# Patient Record
Sex: Male | Born: 1990 | Hispanic: No | Marital: Single | Smoking: Former smoker
Health system: Southern US, Community
[De-identification: ages and names within clinical notes are randomized; demographics above are authoritative.]

## PROBLEM LIST (undated history)

## (undated) DIAGNOSIS — F909 Attention-deficit hyperactivity disorder, unspecified type: Secondary | ICD-10-CM

## (undated) DIAGNOSIS — F419 Anxiety disorder, unspecified: Secondary | ICD-10-CM

## (undated) HISTORY — DX: Attention-deficit hyperactivity disorder, unspecified type: F90.9

## (undated) HISTORY — DX: Anxiety disorder, unspecified: F41.9

## (undated) HISTORY — PX: TONSILLECTOMY: SUR1361

---
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
## 2012-06-18 ENCOUNTER — Emergency Department: Payer: Self-pay | Admitting: Emergency Medicine

## 2013-05-16 ENCOUNTER — Emergency Department: Payer: Self-pay | Admitting: Emergency Medicine

## 2014-08-28 ENCOUNTER — Emergency Department: Payer: Self-pay | Admitting: Emergency Medicine

## 2014-09-13 ENCOUNTER — Emergency Department: Payer: Self-pay | Admitting: Emergency Medicine

## 2014-11-09 IMAGING — CR DG CHEST 2V
1 series · 2 of 2 positions shown · non-contrast
Comparison: None.

CLINICAL DATA: Initial encounter for 2 day history of cough and
fever

EXAM:
CHEST  2 VIEW

[Series 1: dxr chest pa (or ap) and lateral · 0.14mm/px · 2 of 2 slices shown]
[im 1/2]
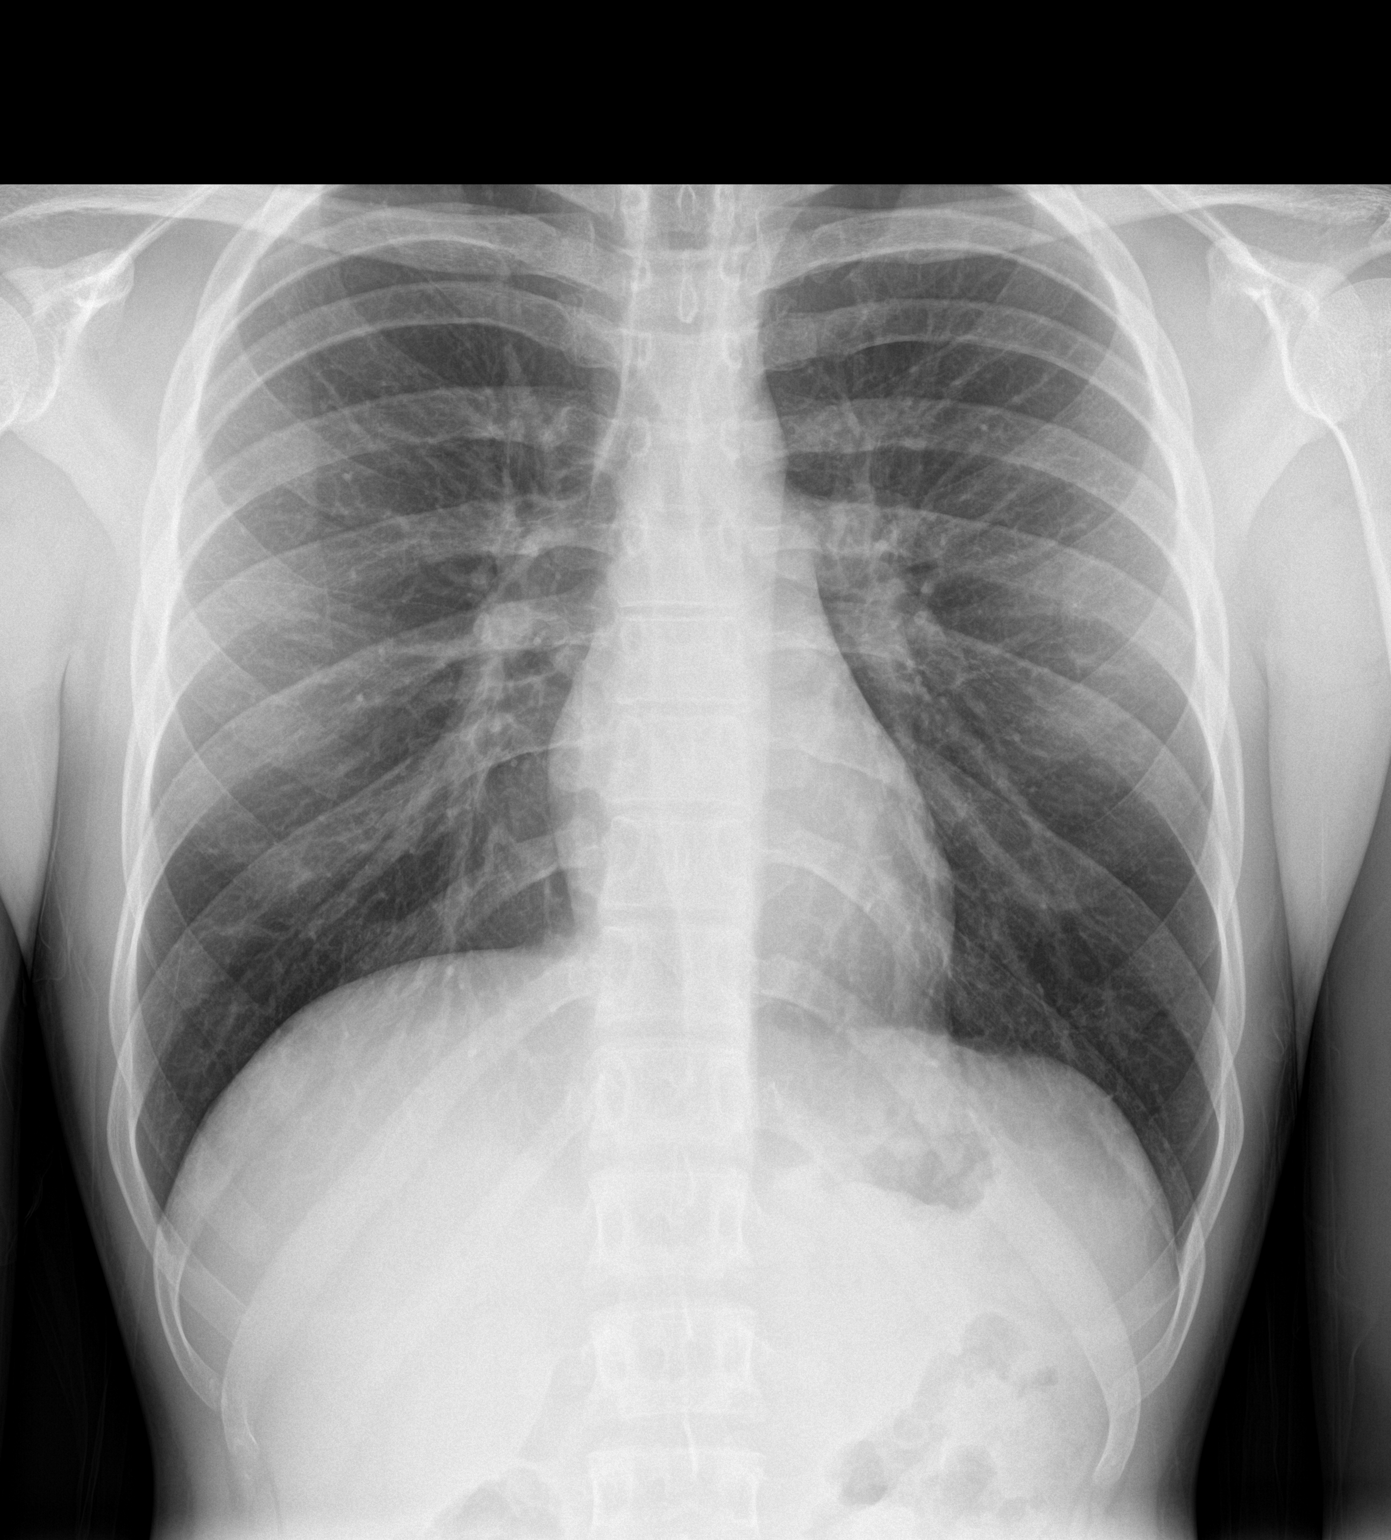
[im 2/2]
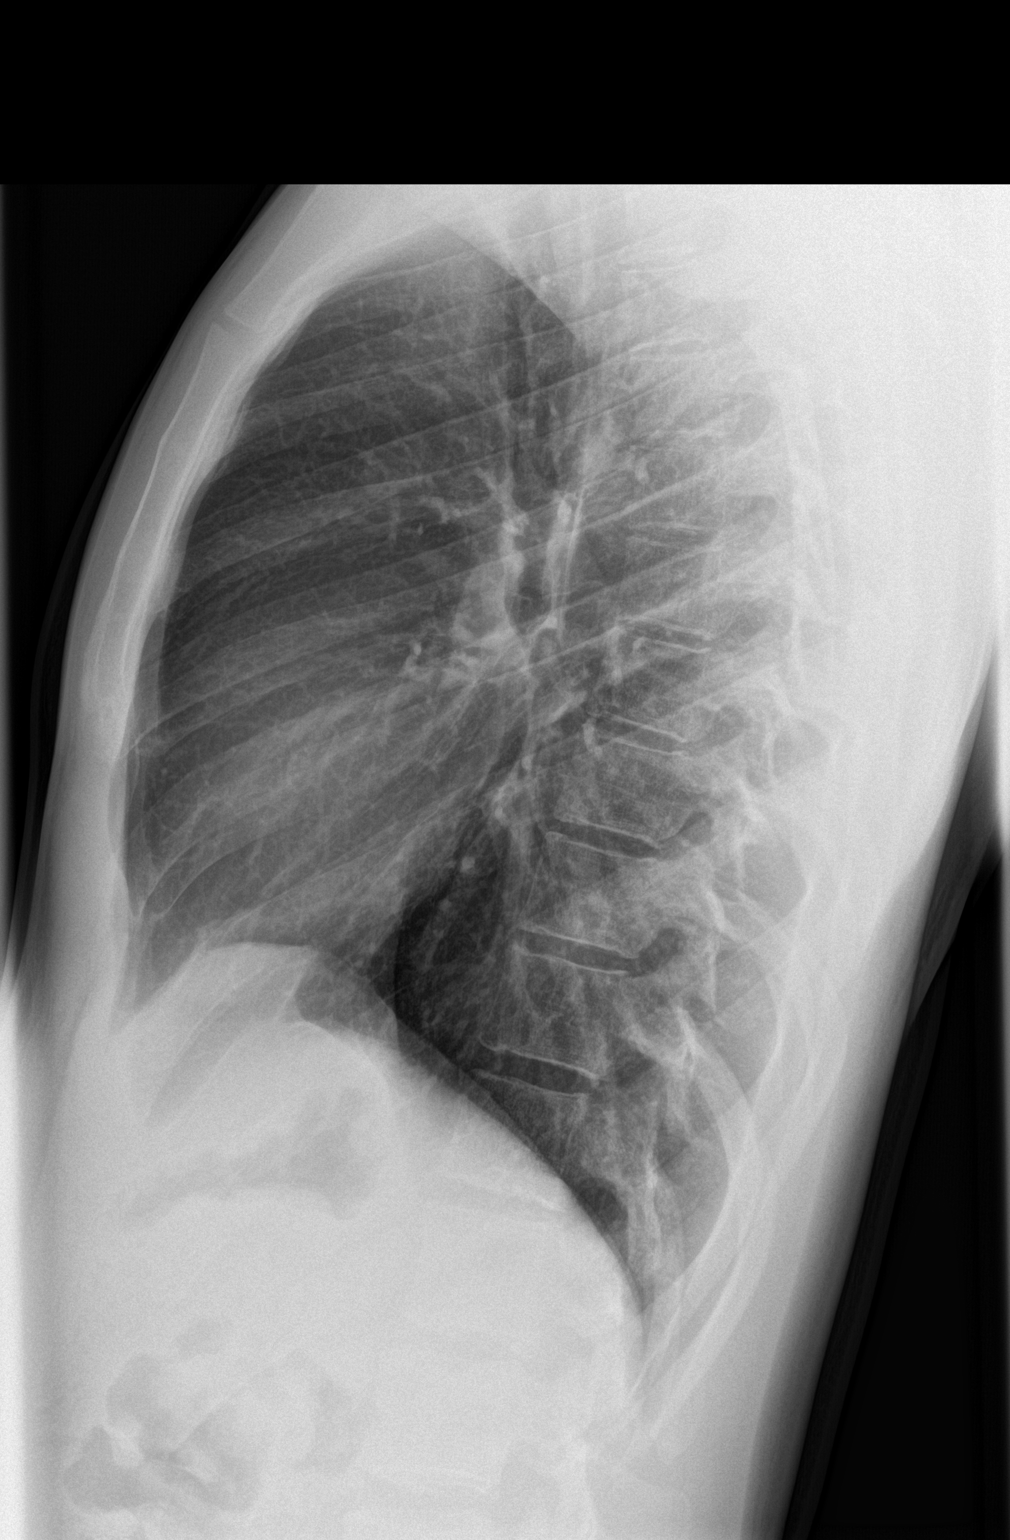

[2 of 2 positions shown; findings below may reference images not displayed]

FINDINGS: The heart size and mediastinal contours are within normal limits.
Both lungs are clear. The visualized skeletal structures are
unremarkable.
IMPRESSION: No active cardiopulmonary disease.

## 2016-03-28 ENCOUNTER — Encounter: Payer: Self-pay | Admitting: Emergency Medicine

## 2016-03-28 ENCOUNTER — Emergency Department
Admission: EM | Admit: 2016-03-28 | Discharge: 2016-03-29 | Disposition: A | Discharge status: Home / Self Care | Payer: 59 | Attending: Emergency Medicine | Admitting: Emergency Medicine

## 2016-03-28 DIAGNOSIS — F329 Major depressive disorder, single episode, unspecified: Secondary | ICD-10-CM | POA: Insufficient documentation

## 2016-03-28 DIAGNOSIS — S59911A Unspecified injury of right forearm, initial encounter: Secondary | ICD-10-CM | POA: Diagnosis present

## 2016-03-28 DIAGNOSIS — Y999 Unspecified external cause status: Secondary | ICD-10-CM | POA: Insufficient documentation

## 2016-03-28 DIAGNOSIS — Y929 Unspecified place or not applicable: Secondary | ICD-10-CM | POA: Insufficient documentation

## 2016-03-28 DIAGNOSIS — Y939 Activity, unspecified: Secondary | ICD-10-CM | POA: Diagnosis not present

## 2016-03-28 DIAGNOSIS — X789XXA Intentional self-harm by unspecified sharp object, initial encounter: Secondary | ICD-10-CM | POA: Diagnosis not present

## 2016-03-28 DIAGNOSIS — Z7289 Other problems related to lifestyle: Secondary | ICD-10-CM

## 2016-03-28 DIAGNOSIS — S51811A Laceration without foreign body of right forearm, initial encounter: Secondary | ICD-10-CM | POA: Insufficient documentation

## 2016-03-28 DIAGNOSIS — IMO0002 Reserved for concepts with insufficient information to code with codable children: Secondary | ICD-10-CM

## 2016-03-28 DIAGNOSIS — F17210 Nicotine dependence, cigarettes, uncomplicated: Secondary | ICD-10-CM | POA: Insufficient documentation

## 2016-03-28 NOTE — ED Notes (Signed)
Pt. Has a self-inflicted laceration to rt. Forearm.  Pt. States "I am a cutter, but I did not mean to cut this deep".

## 2016-03-29 LAB — COMPREHENSIVE METABOLIC PANEL
ALBUMIN: 4.4 g/dL (ref 3.5–5.0)
ALK PHOS: 55 U/L (ref 38–126)
ALT: 16 U/L — AB (ref 17–63)
AST: 17 U/L (ref 15–41)
Anion gap: 9 (ref 5–15)
BUN: 8 mg/dL (ref 6–20)
CHLORIDE: 107 mmol/L (ref 101–111)
CO2: 22 mmol/L (ref 22–32)
CREATININE: 0.77 mg/dL (ref 0.61–1.24)
Calcium: 9 mg/dL (ref 8.9–10.3)
GFR calc non Af Amer: >60 mL/min (ref >60)
GLUCOSE: 163 mg/dL — AB (ref 65–99)
Potassium: 3.6 mmol/L (ref 3.5–5.1)
SODIUM: 138 mmol/L (ref 135–145)
Total Bilirubin: 0.6 mg/dL (ref 0.3–1.2)
Total Protein: 6.8 g/dL (ref 6.5–8.1)

## 2016-03-29 LAB — URINE DRUG SCREEN, QUALITATIVE (ARMC ONLY)
Amphetamines, Ur Screen: NOT DETECTED
Barbiturates, Ur Screen: NOT DETECTED
Benzodiazepine, Ur Scrn: NOT DETECTED
CANNABINOID 50 NG, UR ~~LOC~~: NOT DETECTED
COCAINE METABOLITE, UR ~~LOC~~: NOT DETECTED
MDMA (ECSTASY) UR SCREEN: NOT DETECTED
Methadone Scn, Ur: NOT DETECTED
OPIATE, UR SCREEN: NOT DETECTED
Phencyclidine (PCP) Ur S: NOT DETECTED
Tricyclic, Ur Screen: NOT DETECTED

## 2016-03-29 LAB — CBC
HCT: 46.9 % (ref 40.0–52.0)
HEMOGLOBIN: 15.4 g/dL (ref 13.0–18.0)
MCH: 29.5 pg (ref 26.0–34.0)
MCHC: 32.9 g/dL (ref 32.0–36.0)
MCV: 89.7 fL (ref 80.0–100.0)
PLATELETS: 174 10*3/uL (ref 150–440)
RBC: 5.23 MIL/uL (ref 4.40–5.90)
RDW: 13.5 % (ref 11.5–14.5)
WBC: 6.6 10*3/uL (ref 3.8–10.6)

## 2016-03-29 LAB — SALICYLATE LEVEL: Salicylate Lvl: <4 mg/dL (ref 2.8–30.0)

## 2016-03-29 LAB — ACETAMINOPHEN LEVEL

## 2016-03-29 LAB — ETHANOL: Alcohol, Ethyl (B): 169 mg/dL — ABNORMAL HIGH (ref <5)

## 2016-03-29 MED ORDER — BACITRACIN ZINC 500 UNIT/GM EX OINT
TOPICAL_OINTMENT | CUTANEOUS | Status: AC
  Administered 2016-03-29: 1 via TOPICAL
  Filled 2016-03-29: qty 0.9

## 2016-03-29 MED ORDER — BACITRACIN ZINC 500 UNIT/GM EX OINT
TOPICAL_OINTMENT | Freq: Every day | CUTANEOUS | Status: AC
  Administered 2016-03-29: 1 via TOPICAL

## 2016-03-29 MED ORDER — IBUPROFEN 600 MG PO TABS
ORAL_TABLET | ORAL | Status: AC
  Administered 2016-03-29: 600 mg via ORAL
  Filled 2016-03-29: qty 1

## 2016-03-29 MED ORDER — LIDOCAINE-EPINEPHRINE (PF) 1 %-1:200000 IJ SOLN
INTRAMUSCULAR | Status: AC
  Administered 2016-03-29: 04:00:00 via INTRADERMAL
  Filled 2016-03-29: qty 30

## 2016-03-29 MED ORDER — IBUPROFEN 600 MG PO TABS
600.0000 mg | ORAL_TABLET | Freq: Once | ORAL | Status: AC
  Administered 2016-03-29: 600 mg via ORAL

## 2016-03-29 MED ORDER — LIDOCAINE-EPINEPHRINE (PF) 2 %-1:200000 IJ SOLN: 10.0000 mL | Freq: Once | INTRAMUSCULAR | Status: DC

## 2016-03-29 NOTE — ED Notes (Signed)
Patient stable and ambulatory.  Verbalized understanding of discharge instructions.   

## 2016-03-29 NOTE — ED Provider Notes (Signed)
[facility] Emergency Department Provider Note  ____________________________________________  Time seen: Approximately 12:03 AM  I have reviewed the triage vital signs and the nursing notes.   HISTORY  Chief Complaint Laceration    HPI Lance Fox is a 25 y.o. male who comes into the hospital today with a self-inflicted cut to his right forearm. The patient reports that he was cutting but did not mean to cut some deeply. When asked him if he was trying to kill himself he said yes and no. He reports that when he noticed how deep it was he decided to call the ambulance. When asked why and what made him cut he reports that he is unable to answer. The patient reports that he has been feeling depressed but does not take any medications for it. He reports that he does cut but is sporadically. He was drinking tonight and reports he had a 40 ounce beer at about 10:15 and 30 minutes later had another 25 ounces. He also said tonight that he took a migraine pill. The patient is very evasive and unable to answer questions but he does make good eye contact. He's had no hallucinations and no other complaints at this time.   History reviewed. No pertinent past medical history.  There are no active problems to display for this patient.   Past Surgical History  Procedure Laterality Date  . Tonsillectomy      No current outpatient prescriptions on file.  Allergies Review of patient's allergies indicates no known allergies.  Family History  Problem Relation Age of Onset  . Heart failure Other     Social History Social History  Substance Use Topics  . Smoking status: Current Every Day Smoker -- 1.00 packs/day    Types: Cigarettes  . Smokeless tobacco: None  . Alcohol Use: 0.6 oz/week    1 Cans of beer per week    Review of Systems Constitutional: No fever/chills Eyes: No visual changes. ENT: No sore throat. Cardiovascular: Denies chest  pain. Respiratory: Denies shortness of breath. Gastrointestinal: No abdominal pain.  No nausea, no vomiting.  No diarrhea.  No constipation. Genitourinary: Negative for dysuria. Musculoskeletal: Negative for back pain. Skin: Laceration to right forearm Neurological: Negative for headaches, focal weakness or numbness.  10-point ROS otherwise negative.  ____________________________________________   PHYSICAL EXAM:  VITAL SIGNS: ED Triage Vitals  Enc Vitals Group     BP 03/28/16 2355 131/72 mmHg     Pulse Rate 03/28/16 2355 96     Resp 03/28/16 2355 18     Temp 03/28/16 2355 97.8 F (36.6 C)     Temp src --      SpO2 03/28/16 2350 98 %     Weight 03/28/16 2355 155 lb (70.308 kg)     Height 03/28/16 2355 6\' 2"  (1.88 m)     Head Cir --      Peak Flow --      Pain Score 03/28/16 2356 4     Pain Loc --      Pain Edu? --      Excl. in GC? --     Constitutional: Alert and oriented. Well appearing and in Moderate distress. Eyes: Conjunctivae are normal. PERRL. EOMI. Head: Atraumatic. Nose: No congestion/rhinnorhea. Mouth/Throat: Mucous membranes are moist.  Oropharynx non-erythematous. Cardiovascular: Normal rate, regular rhythm. Grossly normal heart sounds.  Good peripheral circulation. Respiratory: Normal respiratory effort.  No retractions. Lungs CTAB. Gastrointestinal: Soft and nontender. No distention. Positive bowel sounds Musculoskeletal:  No lower extremity tenderness nor edema. The patient is able to move all the fingers on his right forearm where the laceration is located. Color motion and sensation is intact. Neurologic:  Normal speech and language. Skin:  Skin is warm, dry and intact. 3-1/2-4 inch laceration to the patient's right forearm horizontally. Psychiatric: Mood and affect are normal.   ____________________________________________   LABS (all labs ordered are listed, but only abnormal results are displayed)  Labs Reviewed  COMPREHENSIVE METABOLIC PANEL -  Abnormal; Notable for the following:    Glucose, Bld 163 (*)    ALT 16 (*)    All other components within normal limits  ETHANOL - Abnormal; Notable for the following:    Alcohol, Ethyl (B) 169 (*)    All other components within normal limits  ACETAMINOPHEN LEVEL - Abnormal; Notable for the following:    Acetaminophen (Tylenol), Serum <10 (*)    All other components within normal limits  CBC  URINE DRUG SCREEN, QUALITATIVE ([facility] ONLY)  SALICYLATE LEVEL   ____________________________________________  EKG  None ____________________________________________  RADIOLOGY  None ____________________________________________   PROCEDURES  Procedure(s) performed: please, see procedure note(s).   LACERATION REPAIR Performed by: Lucrezia Europe P Authorized by: Lucrezia Europe P Consent: Verbal consent obtained. Risks and benefits: risks, benefits and alternatives were discussed Consent given by: patient Patient identity confirmed: provided demographic data Prepped and Draped in normal sterile fashion Wound explored  Laceration Location: right forearm  Laceration Length: 12 cm  No Foreign Bodies seen or palpated  Anesthesia: local infiltration  Local anesthetic: lidocaine 1% with epinephrine  Anesthetic total: 4 ml  Irrigation method: syringe Amount of cleaning: standard  Skin closure: 5.0 vicryl, 4.0 eithilon  Number of sutures: 12 superficial, 4 deep  Technique: locked continuous and simple interrupted  Patient tolerance: Patient tolerated the procedure well with no immediate complications.   Critical Care performed: No  ____________________________________________   INITIAL IMPRESSION / ASSESSMENT AND PLAN / ED COURSE  Pertinent labs & imaging results that were available during my care of the patient were reviewed by me and considered in my medical decision making (see chart for details).  This is a 25 year old male who comes into the hospital today  with a self-inflicted laceration to his right forearm. The patient does have some significant bleeding but otherwise has no other injury or concern. I will have TTS evaluate the patient as he is very evasive about his intention with cutting his arm. I will also repair the patient's laceration. I will check some blood work on the patient's well.  The patient was evaluated by TTS. The patient informed them that he was not suicidal. I did go back into talk to the patient and he reports that he is not suicidal. While he is depressed he does not feel that he needs to stay in the hospital and he rather follow up with someone as an outpatient. I confirmed this with the patient's mother and while she was skeptical as to the patient's statements she felt that he should see a therapist and was not concerned about him staying to see a psychiatrist. The patient did receive some sutures and the bleeding of his wound was controlled. The patient will be discharged to follow-up with [facility] for further evaluation and treatment of his depression. The patient contracted for safety with TTS prior to his discharge.  ____________________________________________   FINAL CLINICAL IMPRESSION(S) / ED DIAGNOSES  Final diagnoses:  Laceration  Self-inflicted injury  Rebecka Apley, MD 03/29/16 [phone]

## 2016-03-29 NOTE — BH Assessment (Signed)
Assessment Note  Lance Fox is an 25 y.o. male presenting to the ED with a self-inflicted cuts to his right forearm. Patient reports that he was cutting but did not mean to cut so deeply. When asked him if he was trying to kill himself he said yes and no. He reports that he decided to call EMS after he realized how deep the cuts were. When asked why and what made him cut he reports that he is unable to answer. The patient reports that he has been feeling depressed but does not take any medications for it. He reports that he does cut but is sporadically. He did eventually state that he did not want to kill himself.  Pt agreed to contact for safety.  Pt did admit that he had been drinking tonight.  Pt denies any HI and any auditory/visual hallucinations.  Diagnosis: Depression  Past Medical History: History reviewed. No pertinent past medical history.  Past Surgical History  Procedure Laterality Date  . Tonsillectomy      Family History:  Family History  Problem Relation Age of Onset  . Heart failure Other     Social History:  reports that he has been smoking Cigarettes.  He has been smoking about 1.00 pack per day. He does not have any smokeless tobacco history on file. He reports that he drinks about 0.6 oz of alcohol per week. He reports that he does not use illicit drugs.  Additional Social History:  Alcohol / Drug Use History of alcohol / drug use?: No history of alcohol / drug abuse (Pt denies)  CIWA: CIWA-Ar BP: 132/85 mmHg Pulse Rate: 95 COWS:    Allergies: No Known Allergies  Home Medications:  (Not in a hospital admission)  OB/GYN Status:  No LMP for male patient.  General Assessment Data Location of Assessment: [facility] ED TTS Assessment: In system Is this a Tele or Face-to-Face Assessment?: Face-to-Face Is this an Initial Assessment or a Re-assessment for this encounter?: Initial Assessment Marital status: Single Maiden name: N/A Is patient pregnant?:  No Pregnancy Status: No Living Arrangements: Alone Can pt return to current living arrangement?: Yes Admission Status: Voluntary Is patient capable of signing voluntary admission?: Yes Referral Source: Self/Family/Friend Insurance type: [employer]     Crisis Care Plan Living Arrangements: Alone Legal Guardian: Other: (self) Name of Psychiatrist: None reported Name of Therapist: None reported  Education Status Is patient currently in school?: No Current Grade: N/A Highest grade of school patient has completed: 12th Name of school: [employer] person: N/A  Risk to self with the past 6 months Suicidal Ideation: No Has patient been a risk to self within the past 6 months prior to admission? : No Suicidal Intent: No Has patient had any suicidal intent within the past 6 months prior to admission? : No Is patient at risk for suicide?: No Suicidal Plan?: No Has patient had any suicidal plan within the past 6 months prior to admission? : No Access to Means: Yes Specify Access to Suicidal Means: Pt has access to razors What has been your use of drugs/alcohol within the last 12 months?: None reported Previous Attempts/Gestures: No How many times?: 0 Other Self Harm Risks: Pt reports a history of cutting Triggers for Past Attempts: None known Intentional Self Injurious Behavior: Cutting Comment - Self Injurious Behavior: Pt reports superficial cuts on his thighs and forearm Family Suicide History: No Recent stressful life event(s): Other (Comment) (Pt refuses to answer) Persecutory voices/beliefs?: No Depression:  Yes Depression Symptoms: Loss of interest in usual pleasures Substance abuse history and/or treatment for substance abuse?: Yes Suicide prevention information given to non-admitted patients: Not applicable  Risk to Others within the past 6 months Homicidal Ideation: No Does patient have any lifetime risk of violence toward others beyond the six  months prior to admission? : No Thoughts of Harm to Others: No Current Homicidal Intent: No Current Homicidal Plan: No Access to Homicidal Means: No Identified Victim: None identified History of harm to others?: No Assessment of Violence: None Noted Violent Behavior Description: None identified Does patient have access to weapons?: No Criminal Charges Pending?: No Does patient have a court date: No Is patient on probation?: No  Psychosis Hallucinations: None noted Delusions: None noted  Mental Status Report Appearance/Hygiene: In hospital gown Eye Contact: Good Motor Activity: Unable to assess, Freedom of movement Speech: Logical/coherent Level of Consciousness: Alert Mood: Pleasant Affect: Appropriate to circumstance Anxiety Level: None Thought Processes: Coherent, Relevant Judgement: Partial Orientation: Person, Place, Time, Situation Obsessive Compulsive Thoughts/Behaviors: None  Cognitive Functioning Concentration: Normal Memory: Recent Intact, Remote Intact IQ: Average Insight: Fair Impulse Control: Fair Appetite: Good Weight Loss: 0 Weight Gain: 0 Sleep: No Change Total Hours of Sleep: 6 Vegetative Symptoms: None  ADLScreening [facility] Assessment [facility]) Patient's cognitive ability adequate to safely complete daily activities?: Yes Patient able to express need for assistance with ADLs?: Yes Independently performs ADLs?: Yes (appropriate for developmental age)  Prior Inpatient Therapy Prior Inpatient Therapy: No Prior Therapy Dates: N/A Prior Therapy Facilty/Provider(s): N/A Reason for Treatment: N/A  Prior Outpatient Therapy Prior Outpatient Therapy: No Prior Therapy Dates: N/A Prior Therapy Facilty/Provider(s): N/A Reason for Treatment: N/A Does patient have an ACCT team?: No Does patient have Intensive In-House Services?  : No Does patient have [facility] services? : No Does patient have P4CC services?: No  ADL Screening (condition at time of  admission) Patient's cognitive ability adequate to safely complete daily activities?: Yes Patient able to express need for assistance with ADLs?: Yes Independently performs ADLs?: Yes (appropriate for developmental age)       Abuse/Neglect Assessment (Assessment to be complete while patient is alone) Physical Abuse: Denies Verbal Abuse: Denies Sexual Abuse: Denies Exploitation of patient/patient's resources: Denies Self-Neglect: Denies Values / Beliefs Cultural Requests During Hospitalization: None Spiritual Requests During Hospitalization: None Consults Spiritual Care Consult Needed: No Social Work Consult Needed: No Merchant navy officerAdvance Directives (For Healthcare) Does patient have an advance directive?: No    Additional Information 1:1 In Past 12 Months?: No CIRT Risk: No Elopement Risk: No Does patient have medical clearance?: Yes     Disposition:  Disposition Initial Assessment Completed for this Encounter: Yes Disposition of Patient: Referred to Patient referred to: Outpatient clinic referral  On Site Evaluation by:   Reviewed with Physician:    Artist Beachoxana C Sollie Vultaggio 03/29/2016 1:46 AM

## 2016-03-29 NOTE — Discharge Instructions (Signed)
No-harm Safety Contract  A no-harm safety contract is a written or verbal agreement between you and a mental health professional to promote safety. It contains specific actions and promises you agree to. The agreement also includes instructions from the therapist or doctor. The instructions will help prevent you from harming yourself or harming others. Harm can be as mild as pinching yourself, but can increase in intensity to actions like burning or cutting yourself. The extreme level of self-harm would be committing suicide. No-harm safety contracts are also sometimes referred to as a no-suicide contract, suicide prevention contract, no-harm agreements or decisions, or a safety contract.   REASONS FOR NO-HARM SAFETY CONTRACTS  Safety contracts are just one part of an overall treatment plan to help keep you safe and free of harm. A safety contract may help to relieve anxiety, restore a sense of control, state clearly the alternatives to harm or suicide, and give you and your therapist or doctor a gauge for how you are doing in between visits.  Many factors impact the decision to use a no-harm safety contract and its effectiveness. A proper overall treatment plan and evaluation and good patient understanding are the keys to good outcomes.  CONTRACT ELEMENTS   A contract can range from simple to complex. They include all or some of the following:   Action statements. These are statements you agree to do or not do.  Example: If I feel my life is becoming too difficult, I agree to do the following so there is no harm to myself or others:  · Talk with family or friends.  · Rid myself of all things that I could use to harm myself.  · Do an activity I enjoy or have enjoyed in the recent past.  Coping strategies. These are ways to think and feel that decrease stress, such as:  · Use of affirmations or positive statements about self.  · Good self-care, including improved grooming, and healthy eating, and healthy sleeping  patterns.  · Increase physical exercise.  · Increase social involvement.  · Focus on positive aspects of life.  Crisis management. This would include what to do if there was trouble following the contract or an urge to harm. This might include notifying family or your therapist of suicidal thoughts. Be open and honest about suicidal urges. To prevent a crisis, do the following:  · List reasons to reach out for support.  · Keep contact numbers and available hours handy.  Treatment goals. These are goals would include no suicidal thoughts, improved mood, and feelings of hopefulness.  Listed responsibilities of different people involved in care. This could include family members. A family member may agree to remove firearms or other lethal weapons/substances from your ease of access.  A timeline. A timeline can be in place from one therapy session to the next session.  HOME CARE INSTRUCTIONS   · Follow your no-harm safety contract.  · Contact your therapist and/or doctor if you have any questions or concerns.  MAKE SURE YOU:   · Understand these instructions.  · Will watch your condition. Noticing any mood changes or suicidal urges.  · Will get help right away if you are not doing well or get worse.     This information is not intended to replace advice given to you by your health care provider. Make sure you discuss any questions you have with your health care provider.     Document Released: 05/14/2010 Document Revised: 12/15/2014 Document Reviewed: 05/14/2010    Elsevier Interactive Patient Education ©2016 [employer] Inc[employer]

## 2016-03-29 NOTE — ED Notes (Signed)
Patient had lacerations sutured by Zenda AlpersWebster MD. Bacitracin and gauze applied to wound be MD order.

## 2016-04-09 ENCOUNTER — Emergency Department
Admission: EM | Admit: 2016-04-09 | Discharge: 2016-04-09 | Disposition: A | Discharge status: Home / Self Care | Payer: 59 | Attending: Emergency Medicine | Admitting: Emergency Medicine

## 2016-04-09 ENCOUNTER — Encounter: Payer: Self-pay | Admitting: Emergency Medicine

## 2016-04-09 DIAGNOSIS — F17210 Nicotine dependence, cigarettes, uncomplicated: Secondary | ICD-10-CM | POA: Diagnosis not present

## 2016-04-09 DIAGNOSIS — Z4802 Encounter for removal of sutures: Secondary | ICD-10-CM | POA: Diagnosis not present

## 2016-04-09 NOTE — Discharge Instructions (Signed)

## 2016-04-09 NOTE — ED Notes (Signed)
Here for suture removal to right forearm

## 2016-04-09 NOTE — ED Provider Notes (Signed)
[facility] Emergency Department Provider Note  ____________________________________________  Time seen: Approximately 5:35 PM  I have reviewed the triage vital signs and the nursing notes.   HISTORY  Chief Complaint Suture / Staple Removal    HPI Lance Fox is a 25 y.o. male , NAD, presents emergency department for removal of sutures. States he was seen in the emergency department approximately a week and a half ago and had sutures placed after he cut the right forearm. Denies any oozing or weeping at the site. Has had not had any redness or swelling. Denies any fevers, chills, body aches. States that the wounds have been healing well.   History reviewed. No pertinent past medical history.  There are no active problems to display for this patient.   Past Surgical History  Procedure Laterality Date  . Tonsillectomy      No current outpatient prescriptions on file.  Allergies Review of patient's allergies indicates no known allergies.  Family History  Problem Relation Age of Onset  . Heart failure Other     Social History Social History  Substance Use Topics  . Smoking status: Current Every Day Smoker -- 1.00 packs/day    Types: Cigarettes  . Smokeless tobacco: None  . Alcohol Use: 0.6 oz/week    1 Cans of beer per week     Review of Systems  Constitutional: No fever/chills Cardiovascular: No chest pain. Respiratory:  No shortness of breath.  Gastrointestinal: No abdominal pain.  No nausea, vomiting. Musculoskeletal: Negative for right arm pain.  Skin: Positive laceration right forearm with sutures in place. Negative for rash or redness, swelling, oozing, weeping, skin sores. Neurological: Negative for headaches, focal weakness or numbness. No tingling 10-point ROS otherwise negative.  ____________________________________________   PHYSICAL EXAM:  VITAL SIGNS: ED Triage Vitals  Enc Vitals Group     BP --      Pulse --       Resp --      Temp --      Temp src --      SpO2 --      Weight --      Height --      Head Cir --      Peak Flow --      Pain Score --      Pain Loc --      Pain Edu? --      Excl. in GC? --      Constitutional: Alert and oriented. Well appearing and in no acute distress. Eyes: Conjunctivae are normal.  Head: Atraumatic. Cardiovascular:   Good peripheral circulation. Respiratory: Normal respiratory effort without tachypnea or retractions.  Musculoskeletal: Full range of motion of right upper extremity without pain. Patient able to pronate and supinate the right forearm without pain.  Neurologic:  Normal speech and language. No gross focal neurologic deficits are appreciated.  Skin:  Positive laceration noted to the right forearm with 11 sutures in place. Skin is warm, dry and intact. No rash, redness, swelling noted. Psychiatric: Mood and affect are normal. Speech and behavior are normal. Patient exhibits appropriate insight and judgement.   ____________________________________________   LABS  None ____________________________________________  EKG  None ____________________________________________  RADIOLOGY  None ____________________________________________    PROCEDURES  Procedure(s) performed: SUTURE REMOVAL Performed by: Lance Fox  Consent: Verbal consent obtained. Patient identity confirmed: provided demographic data Time out: Immediately prior to procedure a "time out" was called to verify the correct patient, procedure, equipment,  support staff and site/side marked as required.  Location details: right forearm  Wound Appearance: clean  Sutures/Staples Removed: 11  Facility: sutures placed in this facility Patient tolerance: Patient tolerated the procedure well with no immediate complications.   Medications - No data to display   ____________________________________________   INITIAL IMPRESSION / ASSESSMENT AND PLAN / ED  COURSE  Patient's diagnosis is consistent with Encounter for suture removal. Patient will be discharged home with instructions for home care. Patient is to follow up with Lance Fox as needed.  Patient is given ED precautions to return to the ED for any worsening or new symptoms.    ____________________________________________  FINAL CLINICAL IMPRESSION(S) / ED DIAGNOSES  Final diagnoses:  Encounter for removal of sutures      NEW MEDICATIONS STARTED DURING THIS VISIT:  New Prescriptions   No medications on file         Lance PigeonJami L Delila Kuklinski, PA-C 04/09/16 1756  Lance SemenGraydon Goodman, MD 04/09/16 1818

## 2016-08-01 ENCOUNTER — Emergency Department
Admission: EM | Admit: 2016-08-01 | Discharge: 2016-08-01 | Disposition: A | Discharge status: Home / Self Care | Payer: 59 | Attending: Emergency Medicine | Admitting: Emergency Medicine

## 2016-08-01 DIAGNOSIS — S61216A Laceration without foreign body of right little finger without damage to nail, initial encounter: Secondary | ICD-10-CM | POA: Diagnosis not present

## 2016-08-01 DIAGNOSIS — Z23 Encounter for immunization: Secondary | ICD-10-CM | POA: Insufficient documentation

## 2016-08-01 DIAGNOSIS — F17210 Nicotine dependence, cigarettes, uncomplicated: Secondary | ICD-10-CM | POA: Diagnosis not present

## 2016-08-01 DIAGNOSIS — Y999 Unspecified external cause status: Secondary | ICD-10-CM | POA: Insufficient documentation

## 2016-08-01 DIAGNOSIS — Y929 Unspecified place or not applicable: Secondary | ICD-10-CM | POA: Diagnosis not present

## 2016-08-01 DIAGNOSIS — Y939 Activity, unspecified: Secondary | ICD-10-CM | POA: Diagnosis not present

## 2016-08-01 DIAGNOSIS — S61219A Laceration without foreign body of unspecified finger without damage to nail, initial encounter: Secondary | ICD-10-CM

## 2016-08-01 DIAGNOSIS — W25XXXA Contact with sharp glass, initial encounter: Secondary | ICD-10-CM | POA: Insufficient documentation

## 2016-08-01 MED ORDER — TETANUS-DIPHTH-ACELL PERTUSSIS 5-2.5-18.5 LF-MCG/0.5 IM SUSP
0.5000 mL | Freq: Once | INTRAMUSCULAR | Status: AC
  Administered 2016-08-01: 0.5 mL via INTRAMUSCULAR
  Filled 2016-08-01: qty 0.5
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
## 2017-05-22 ENCOUNTER — Emergency Department
Admission: EM | Admit: 2017-05-22 | Discharge: 2017-05-22 | Disposition: A | Discharge status: Home / Self Care | Payer: 59 | Attending: Emergency Medicine | Admitting: Emergency Medicine

## 2017-05-22 ENCOUNTER — Encounter: Payer: Self-pay | Admitting: Emergency Medicine

## 2017-05-22 DIAGNOSIS — K029 Dental caries, unspecified: Secondary | ICD-10-CM | POA: Diagnosis not present

## 2017-05-22 DIAGNOSIS — F17210 Nicotine dependence, cigarettes, uncomplicated: Secondary | ICD-10-CM | POA: Insufficient documentation

## 2017-05-22 DIAGNOSIS — K047 Periapical abscess without sinus: Secondary | ICD-10-CM | POA: Diagnosis not present

## 2017-05-22 DIAGNOSIS — K0889 Other specified disorders of teeth and supporting structures: Secondary | ICD-10-CM | POA: Diagnosis present

## 2017-05-22 MED ORDER — PENICILLIN V POTASSIUM 500 MG PO TABS: 500.0000 mg | ORAL_TABLET | Freq: Four times a day (QID) | ORAL | 0 refills | Status: DC

## 2017-05-22 MED ORDER — PENICILLIN V POTASSIUM 500 MG PO TABS
500.0000 mg | ORAL_TABLET | Freq: Once | ORAL | Status: AC
  Administered 2017-05-22: 500 mg via ORAL
  Filled 2017-05-22: qty 1

## 2017-05-22 MED ORDER — LIDOCAINE-EPINEPHRINE 2 %-1:100000 IJ SOLN
1.7000 mL | Freq: Once | INTRAMUSCULAR | Status: AC
  Administered 2017-05-22: 1.7 mL
  Filled 2017-05-22: qty 1.7

## 2017-05-22 MED ORDER — TRAMADOL HCL 50 MG PO TABS: 50.0000 mg | ORAL_TABLET | Freq: Two times a day (BID) | ORAL | 0 refills | Status: DC

## 2017-05-22 NOTE — ED Triage Notes (Signed)
Pt reports right side lower jaw pain for over one week.

## 2017-05-22 NOTE — ED Notes (Signed)

## 2017-05-22 NOTE — Discharge Instructions (Signed)
Take the antibiotic as directed, until all pills are gone. Rinse with warm-salt water after every meal. Brush twice daily with a soft-bristled toothbrush. Follow-up with one of the dental clinics for definitive treatment.   OPTIONS FOR DENTAL FOLLOW UP CARE  [place] Department of Health and Human Services - Local Safety Net Dental Clinics TripDoors.comhttp://www.ncdhhs.gov/dph/oralhealth/services/safetynetclinics.htm   [facility] [phone])  [facility] [phone])  [facility] [phone])  [facility] [phone])  [facility] [phone]) This clinic caters to the indigent population and is on a lottery system. Location: [employer], [address], [place] [facility] Hours: Wednesdays from 6pm - 9pm, patients seen by a lottery system. For dates, call or go to [email or web address] Services: Cleanings, fillings and simple extractions. Payment Options: DENTAL WORK IS FREE OF CHARGE. Bring proof of income or support. Best way to get seen: Arrive at 5:15 pm - this is a lottery, NOT first come/first serve, so arriving earlier will not increase your chances of being seen.     [facility] [phone] Select option 1 for emergencies   Location: [facility], [address], [place] [facility] Hours: No walk-ins accepted - call the day before to schedule an appointment. Check in times are 9:30 am and 1:30 pm. Services: Simple extractions, temporary fillings, pulpectomy/pulp debridement, uncomplicated abscess drainage. Payment Options: PAYMENT IS DUE AT THE TIME OF SERVICE.  Fee is usually $100-200, additional surgical procedures (e.g. abscess drainage) may be extra. Cash, checks, Visa/MasterCard accepted.  Can file Medicaid if patient is covered for dental - patient should call case worker to check. No discount for  [facility] patients. Best way to get seen: MUST call the day before and get onto the schedule. Can usually be seen the next 1-2 days. No walk-ins accepted.     [facility] [phone]   Location: [facility], [address], [place] [facility] Hours: M, W, Th, F 8am or 1:30pm, Tues 9a or 1:30 - first come/first served. Services: Simple extractions, temporary fillings, uncomplicated abscess drainage.  You do not need to be an [place] [place] resident. Payment Options: PAYMENT IS DUE AT THE TIME OF SERVICE. Dental insurance, otherwise sliding scale - bring proof of income or support. Depending on income and treatment needed, cost is usually $50-200. Best way to get seen: Arrive early as it is first come/first served.     [facility] [phone]   Location: [address] Clinic Hours: Mon-Thu 8a-5p Services: Most basic dental services including extractions and fillings. Payment Options: PAYMENT IS DUE AT THE TIME OF SERVICE. Sliding scale, up to 50% off - bring proof if income or support. Medicaid with dental option accepted. Best way to get seen: Call to schedule an appointment, can usually be seen within 2 weeks OR they will try to see walk-ins - show up at 8a or 2p (you may have to wait).     [facility] [phone] [place] RESIDENTS ONLY   Location: [facility], [address], [place], [place] [address] Clinic Hours: By appointment only. Monday - Thursday 8am-5pm, Friday 8am-12pm Services: Cleanings, fillings, extractions. Payment Options: PAYMENT IS DUE AT THE TIME OF SERVICE. Cash, Visa or MasterCard. Sliding scale - $30 minimum per service. Best way to get seen: Come in to office, complete packet and make an appointment - need proof of income or  support monies for each household member and proof of [place] residence. Usually takes  about a month to get in.     [facility] [phone]   Location: [address] [facility] Hours: Walk-in Urgent Care Dental Services are offered Monday-Friday mornings only. The numbers of emergencies accepted daily is limited to the number of providers available. Maximum 15 - Mondays, Wednesdays & Thursdays Maximum 10 - Tuesdays & Fridays Services: You do not need to be a [place] resident to be seen for a dental emergency. Emergencies are defined as pain, swelling, abnormal bleeding, or dental trauma. Walkins will receive x-rays if needed. NOTE: Dental cleaning is not an emergency. Payment Options: PAYMENT IS DUE AT THE TIME OF SERVICE. Minimum co-pay is $40.00 for uninsured patients. Minimum co-pay is $3.00 for Medicaid with dental coverage. Dental Insurance is accepted and must be presented at time of visit. Medicare does not cover dental. Forms of payment: Cash, credit card, checks. Best way to get seen: If not previously registered with the clinic, walk-in dental registration begins at 7:15 am and is on a first come/first serve basis. If previously registered with the clinic, call to make an appointment.     [facility] [phone] [place] RESIDENTS ONLY   Location: [address], [place], [place] Clinic Hours: Mon-Thu 10a-2p Services: Extractions only! Payment Options: FREE (donations accepted) - bring proof of income or support Best way to get seen: Call and schedule an appointment OR come at 8am on the 1st Monday of every month (except for holidays) when it is first come/first served.     [facility] [phone]   Location: [address], [place] Clinic Hours: Friday mornings Services, Payment Options, Best way to get seen: Call for info

## 2017-05-22 NOTE — ED Provider Notes (Signed)
[facility] Emergency Department Provider Note ____________________________________________  Time seen: 1336  I have reviewed the triage vital signs and the nursing notes.  HISTORY  Chief Complaint  Dental Pain  HPI Lance Fox is a 26 y.o. male presents to the ED for evaluation of pain to the lower right jaw at the moment. He describes symptoms of been present and intermittent for the last week. He denies any fevers, chills, sweats present also denies any purulent drainage, or significant facial swelling. He does admit to a chronically broken wisdom tooth. He does not have a current dental provider.  History reviewed. No pertinent past medical history.  There are no active problems to display for this patient.   Past Surgical History:  Procedure Laterality Date  . TONSILLECTOMY      Prior to Admission medications   Medication Sig Start Date End Date Taking? Authorizing Provider  penicillin v potassium (VEETID) 500 MG tablet Take 1 tablet (500 mg total) by mouth 4 (four) times daily. 05/22/17   Shamar Kracke, Charlesetta Ivory, PA-C  traMADol (ULTRAM) 50 MG tablet Take 1 tablet (50 mg total) by mouth 2 (two) times daily. 05/22/17   Cyera Balboni, Charlesetta Ivory, PA-C    Allergies Patient has no known allergies.  Family History  Problem Relation Age of Onset  . Heart failure Other     Social History Social History  Substance Use Topics  . Smoking status: Current Every Day Smoker    Packs/day: 1.00    Types: Cigarettes  . Smokeless tobacco: Never Used  . Alcohol use 0.6 oz/week    1 Cans of beer per week    Review of Systems  Constitutional: Negative for fever. Eyes: Negative for visual changes. ENT: Negative for sore throat. Dental pain as above. Cardiovascular: Negative for chest pain. Respiratory: Negative for shortness of breath. Skin: Negative for rash. ____________________________________________  PHYSICAL EXAM:  VITAL SIGNS: ED Triage  Vitals [05/22/17 1300]  Enc Vitals Group     BP 125/64     Pulse Rate 93     Resp 18     Temp 98.8 F (37.1 C)     Temp Source Oral     SpO2 99 %     Weight 161 lb (73 kg)     Height 6\' 1"  (1.854 m)     Head Circumference      Peak Flow      Pain Score 9     Pain Loc      Pain Edu?      Excl. in GC?     Constitutional: Alert and oriented. Well appearing and in no distress. Head: Normocephalic and atraumatic. Eyes: Conjunctivae are normal. Normal extraocular movements Mouth/Throat: Mucous membranes are moist. Patient with a chronically broken right lower molar tooth. Some focal swelling is noted around the gumline. No fluctuance, pointing, or spontaneous drainage is noted. No brawny sublingual erythema is noted. Neck: Supple. No thyromegaly. Hematological/Lymphatic/Immunological: No cervical lymphadenopathy. Cardiovascular: Normal rate, regular rhythm. Normal distal pulses. Respiratory: Normal respiratory effort. No wheezes/rales/rhonchi. ____________________________________________  PROCEDURES  Pen VK 500 mg PO  DENTAL BLOCK  Performed by: Lissa Hoard Consent: Verbal consent obtained. Required items: devices and special equipment available Time out: Immediately prior to procedure a "time out" was called to verify the correct patient, procedure, equipment, support staff and site/side marked as required.  Indication: pain Nerve block body site: right lower 3rd molar  Preparation: Patient was prepped and draped in the  usual sterile fashion. Needle gauge: 27 G Location technique: anatomical landmarks  Local anesthetic: lido-epi 2%-1:100000  Anesthetic total: 1 ml  Outcome: pain improved Patient tolerance: Patient tolerated the procedure well with no immediate complications. ____________________________________________  INITIAL IMPRESSION / ASSESSMENT AND PLAN / ED COURSE  Patient with ED evaluation of acute dental pain secondary to dental caries. He is  discharged with prescriptions for Pen-Vee K and Ultram dose as directed for pain. [facility] or one of the local dental providers for definitive treatment. Return precautions are reviewed. ____________________________________________  FINAL CLINICAL IMPRESSION(S) / ED DIAGNOSES  Final diagnoses:  Pain due to dental caries  Dental abscess      Lissa HoardMenshew, Freyja Govea V Bacon, PA-C 05/22/17 1413    Jene EveryKinner, Robert, MD 05/22/17 1527

## 2018-01-20 ENCOUNTER — Emergency Department
Admission: EM | Admit: 2018-01-20 | Discharge: 2018-01-20 | Disposition: A | Discharge status: Home / Self Care | Payer: 59 | Attending: Emergency Medicine | Admitting: Emergency Medicine

## 2018-01-20 ENCOUNTER — Other Ambulatory Visit: Payer: Self-pay

## 2018-01-20 DIAGNOSIS — K029 Dental caries, unspecified: Secondary | ICD-10-CM | POA: Diagnosis not present

## 2018-01-20 DIAGNOSIS — F17210 Nicotine dependence, cigarettes, uncomplicated: Secondary | ICD-10-CM | POA: Diagnosis not present

## 2018-01-20 DIAGNOSIS — K0889 Other specified disorders of teeth and supporting structures: Secondary | ICD-10-CM | POA: Diagnosis present

## 2018-01-20 DIAGNOSIS — K047 Periapical abscess without sinus: Secondary | ICD-10-CM

## 2018-01-20 MED ORDER — KETOROLAC TROMETHAMINE 30 MG/ML IJ SOLN
30.0000 mg | Freq: Once | INTRAMUSCULAR | Status: AC
  Administered 2018-01-20: 30 mg via INTRAMUSCULAR
  Filled 2018-01-20: qty 1

## 2018-01-20 MED ORDER — AMOXICILLIN 500 MG PO CAPS: 500.0000 mg | ORAL_CAPSULE | Freq: Three times a day (TID) | ORAL | 0 refills | Status: DC

## 2018-01-20 MED ORDER — KETOROLAC TROMETHAMINE 10 MG PO TABS: 10.0000 mg | ORAL_TABLET | Freq: Four times a day (QID) | ORAL | 0 refills | Status: DC | PRN

## 2018-01-20 NOTE — Discharge Instructions (Signed)
OPTIONS FOR DENTAL FOLLOW UP CARE ° °[place] Department of Health and Human Services - Local Safety Net Dental Clinics °http://www.ncdhhs.gov/dph/oralhealth/services/safetynetclinics.htm °  °[facility] ([phone]) ° °[facility] ([phone]) ° °[facility] ([phone]) ° °[facility] ([phone]) ° °[facility] ([phone]) °This clinic caters to the indigent population and is on a lottery system. °Location: °[facility], [address], [place] °Clinic Hours: °Wednesdays from 6pm - 9pm, patients seen by a lottery system. °For dates, call or go to [email or web address] °Services: °Cleanings, fillings and simple extractions. °Payment Options: °DENTAL WORK IS FREE OF CHARGE. Bring proof of income or support. °Best way to get seen: °Arrive at 5:15 pm - this is a lottery, NOT first come/first serve, so arriving earlier will not increase your chances of being seen. °  °  °[facility] °[phone] °Select option 1 for emergencies °  °Location: °[facility], [address], [place] °Clinic Hours: °No walk-ins accepted - call the day before to schedule an appointment. °Check in times are 9:30 am and 1:30 pm. °Services: °Simple extractions, temporary fillings, pulpectomy/pulp debridement, uncomplicated abscess drainage. °Payment Options: °PAYMENT IS DUE AT THE TIME OF SERVICE.  Fee is usually $100-200, additional surgical procedures (e.g. abscess drainage) may be extra. °Cash, checks, Visa/MasterCard accepted.  Can file Medicaid if patient is covered for dental - patient should call case worker to check. °No discount for [facility] patients. °Best way to get seen: °MUST call the day before and get onto the schedule. Can usually be seen the next 1-2 days. No walk-ins accepted. °  °  °[facility] °[phone] °   °Location: °[facility], [address], [place] °Clinic Hours: °M, W, Th, F 8am or 1:30pm, Tues 9a or 1:30 - first come/first served. °Services: °Simple extractions, temporary fillings, uncomplicated abscess drainage.  You do not need to be an [place] resident. °Payment Options: °PAYMENT IS DUE AT THE TIME OF SERVICE. °Dental insurance, otherwise sliding scale - bring proof of income or support. °Depending on income and treatment needed, cost is usually $50-200. °Best way to get seen: °Arrive early as it is first come/first served. °  °  °[facility] °[phone] °  °Location: °[address] °Clinic Hours: °Mon-Thu 8a-5p °Services: °Most basic dental services including extractions and fillings. °Payment Options: °PAYMENT IS DUE AT THE TIME OF SERVICE. °Sliding scale, up to 50% off - bring proof if income or support. °Medicaid with dental option accepted. °Best way to get seen: °Call to schedule an appointment, can usually be seen within 2 weeks OR they will try to see walk-ins - show up at 8a or 2p (you may have to wait). °  °  °[facility] °[phone] °[place] RESIDENTS ONLY °  °Location: °[facility], [address], [place], [place] [address] °Clinic Hours: By appointment only. °Monday - Thursday 8am-5pm, Friday 8am-12pm °Services: Cleanings, fillings, extractions. °Payment Options: °PAYMENT IS DUE AT THE TIME OF SERVICE. °Cash, Visa or MasterCard. Sliding scale - $30 minimum per service. °Best way to get seen: °Come in to office, complete packet and make an appointment - need proof of income °or support monies for each household member and proof of [place] residence. °Usually takes about a month to get in. °  °  °[facility] °[phone] °  °Location: °[address]   [place] °Clinic Hours: Walk-in Urgent Care Dental Services are offered Monday-Friday  mornings only. °The numbers of emergencies accepted daily is limited to the number of °providers available. °Maximum 15 - Mondays, Wednesdays & Thursdays °Maximum 10 - Tuesdays & Fridays °Services: °You do not need to be a [place] resident to be seen for a dental emergency. °Emergencies are defined as pain, swelling, abnormal bleeding, or dental trauma. Walkins will receive x-rays if needed. °NOTE: Dental cleaning is not an emergency. °Payment Options: °PAYMENT IS DUE AT THE TIME OF SERVICE. °Minimum co-pay is $40.00 for uninsured patients. °Minimum co-pay is $3.00 for Medicaid with dental coverage. °Dental Insurance is accepted and must be presented at time of visit. °Medicare does not cover dental. °Forms of payment: Cash, credit card, checks. °Best way to get seen: °If not previously registered with the clinic, walk-in dental registration begins at 7:15 am and is on a first come/first serve basis. °If previously registered with the clinic, call to make an appointment. °  °  °[facility] °[phone] °[place] RESIDENTS ONLY °  °Location: °[address], [place], [place] °Clinic Hours: °Mon-Thu 10a-2p °Services: Extractions only! °Payment Options: °FREE (donations accepted) - bring proof of income or support °Best way to get seen: °Call and schedule an appointment OR come at 8am on the 1st Monday of every month (except for holidays) when it is first come/first served. °  °  °[facility] °[phone] °  °Location: °[address], [place] °Clinic Hours: °Friday mornings °Services, Payment Options, Best way to get seen: °Call for info °

## 2018-01-20 NOTE — ED Provider Notes (Signed)
[facility] Emergency Department Provider Note  ____________________________________________  Time seen: Approximately 12:04 PM  I have reviewed the triage vital signs and the nursing notes.   HISTORY  Chief Complaint Dental Injury    HPI Lance Fox is a 27 y.o. male that presents to the  emergency department for evaluation of left-sided dental pain for 2 months.  Patient states that pain has worsened this week.  He has had swelling on and off for 2 months but none currently.  He is not sure of drainage but states there is a funny taste in his mouth.  He has a dentist appointment on Friday.  No fever, chills, nausea, vomiting.  History reviewed. No pertinent past medical history.  There are no active problems to display for this patient.   Past Surgical History:  Procedure Laterality Date  . TONSILLECTOMY      Prior to Admission medications   Medication Sig Start Date End Date Taking? Authorizing Provider  amoxicillin (AMOXIL) 500 MG capsule Take 1 capsule (500 mg total) by mouth 3 (three) times daily. 01/20/18   Enid Derry, PA-C  ketorolac (TORADOL) 10 MG tablet Take 1 tablet (10 mg total) by mouth every 6 (six) hours as needed. 01/20/18   Enid Derry, PA-C  penicillin v potassium (VEETID) 500 MG tablet Take 1 tablet (500 mg total) by mouth 4 (four) times daily. 05/22/17   Menshew, Charlesetta Ivory, PA-C  traMADol (ULTRAM) 50 MG tablet Take 1 tablet (50 mg total) by mouth 2 (two) times daily. 05/22/17   Menshew, Charlesetta Ivory, PA-C    Allergies Patient has no known allergies.  Family History  Problem Relation Age of Onset  . Heart failure Other     Social History Social History   Tobacco Use  . Smoking status: Current Every Day Smoker    Packs/day: 1.00    Types: Cigarettes  . Smokeless tobacco: Never Used  Substance Use Topics  . Alcohol use: Yes    Alcohol/week: 0.6 oz    Types: 1 Cans of beer per week  . Drug use: No      Review of Systems  Constitutional: No fever/chills Cardiovascular: No chest pain. Respiratory: No SOB. Gastrointestinal: No abdominal pain.  No nausea, no vomiting.   Musculoskeletal: Negative for musculoskeletal pain. Skin: Negative for rash, abrasions, lacerations, ecchymosis.  ____________________________________________   PHYSICAL EXAM:  VITAL SIGNS: ED Triage Vitals  Enc Vitals Group     BP 01/20/18 1025 114/75     Pulse Rate 01/20/18 1025 67     Resp 01/20/18 1025 18     Temp 01/20/18 1025 98.1 F (36.7 C)     Temp Source 01/20/18 1025 Oral     SpO2 01/20/18 1025 100 %     Weight 01/20/18 1026 175 lb (79.4 kg)     Height 01/20/18 1026 6\' 1"  (1.854 m)     Head Circumference --      Peak Flow --      Pain Score 01/20/18 1026 7     Pain Loc --      Pain Edu? --      Excl. in GC? --      Constitutional: Alert and oriented. Well appearing and in no acute distress. Eyes: Conjunctivae are normal. PERRL. EOMI. No discharge. Head: Atraumatic. ENT: No frontal and maxillary sinus tenderness.      Ears: Tympanic membranes pearly gray with good landmarks. No discharge.      Nose: No  congestion/rhinnorhea.      Mouth/Throat: Mucous membranes are moist. Oropharynx non-erythematous. Tonsils not enlarged. No exudates. Uvula midline.  Poor dentition.  Several fractured and decayed teeth.  Top and bottom left back molars with large fractures and decay.  No drainable abscess.  Neck: No stridor.   Cardiovascular:   Good peripheral circulation. Respiratory: Normal respiratory effort without tachypnea or retractions. Gastrointestinal: Bowel sounds 4 quadrants. Soft and nontender to palpation. No guarding or rigidity. No palpable masses. No distention. Musculoskeletal: Full range of motion to all extremities. No gross deformities appreciated. Neurologic:  Normal speech and language. No gross focal neurologic deficits are appreciated.  Skin:  Skin is warm, dry and intact. No rash  noted.   ____________________________________________   LABS (all labs ordered are listed, but only abnormal results are displayed)  Labs Reviewed - No data to display ____________________________________________  EKG   ____________________________________________  RADIOLOGY   No results found.  ____________________________________________    PROCEDURES  Procedure(s) performed:    Procedures    Medications  ketorolac (TORADOL) 30 MG/ML injection 30 mg (30 mg Intramuscular Given 01/20/18 1225)     ____________________________________________   INITIAL IMPRESSION / ASSESSMENT AND PLAN / ED COURSE  Pertinent labs & imaging results that were available during my care of the patient were reviewed by me and considered in my medical decision making (see chart for details).  Review of the [place] CSRS was performed in accordance of the [facility] prior to dispensing any controlled drugs.   Patient presented to the emergency department for evaluation of dental pain for 2 months.  Patient has several fractures and dental cavities and will be covered for infection.  Vital signs and exam are reassuring.  He was given a shot of Toradol.  Patient feels comfortable going home. Patient will be discharged home with prescriptions for amoxicillin and toradol. Patient is to follow up with dentist as needed or otherwise directed. Patient is given ED precautions to return to the ED for any worsening or new symptoms.     ____________________________________________  FINAL CLINICAL IMPRESSION(S) / ED DIAGNOSES  Final diagnoses:  Dental infection  Dental caries      NEW MEDICATIONS STARTED DURING THIS VISIT:  ED Discharge Orders        Ordered    amoxicillin (AMOXIL) 500 MG capsule  3 times daily     01/20/18 1241    ketorolac (TORADOL) 10 MG tablet  Every 6 hours PRN     01/20/18 1241          This chart was dictated using voice recognition software/Dragon. Despite best  efforts to proofread, errors can occur which can change the meaning. Any change was purely unintentional.    Enid DerryWagner, Yogi Arther, PA-C 01/20/18 1407    Jeanmarie PlantMcShane, James A, MD 01/20/18 [phone]

## 2018-01-20 NOTE — ED Triage Notes (Signed)
Right sided top and bottom left toothache. Pt states that this is ongoing X 2 months. Has not seen dentist. Pt alert and oriented X4, active, cooperative, pt in NAD. RR even and unlabored, color WNL.

## 2018-02-11 ENCOUNTER — Emergency Department
Admission: EM | Admit: 2018-02-11 | Discharge: 2018-02-11 | Disposition: A | Discharge status: Home / Self Care | Payer: 59 | Attending: Emergency Medicine | Admitting: Emergency Medicine

## 2018-02-11 ENCOUNTER — Other Ambulatory Visit: Payer: Self-pay

## 2018-02-11 DIAGNOSIS — R42 Dizziness and giddiness: Secondary | ICD-10-CM | POA: Diagnosis present

## 2018-02-11 DIAGNOSIS — F17210 Nicotine dependence, cigarettes, uncomplicated: Secondary | ICD-10-CM | POA: Diagnosis not present

## 2018-02-11 DIAGNOSIS — Z79899 Other long term (current) drug therapy: Secondary | ICD-10-CM | POA: Diagnosis not present

## 2018-02-11 DIAGNOSIS — K625 Hemorrhage of anus and rectum: Secondary | ICD-10-CM

## 2018-02-11 LAB — CBC
HEMATOCRIT: 43.2 % (ref 40.0–52.0)
HEMOGLOBIN: 14 g/dL (ref 13.0–18.0)
MCH: 28.5 pg (ref 26.0–34.0)
MCHC: 32.4 g/dL (ref 32.0–36.0)
MCV: 88.1 fL (ref 80.0–100.0)
Platelets: 239 10*3/uL (ref 150–440)
RBC: 4.9 MIL/uL (ref 4.40–5.90)
RDW: 13.6 % (ref 11.5–14.5)
WBC: 11.9 10*3/uL — ABNORMAL HIGH (ref 3.8–10.6)

## 2018-02-11 LAB — TYPE AND SCREEN
ABO/RH(D): O POS
ANTIBODY SCREEN: NEGATIVE

## 2018-02-11 LAB — COMPREHENSIVE METABOLIC PANEL
ALBUMIN: 4 g/dL (ref 3.5–5.0)
ALT: 28 U/L (ref 17–63)
ANION GAP: 11 (ref 5–15)
AST: 34 U/L (ref 15–41)
Alkaline Phosphatase: 62 U/L (ref 38–126)
BILIRUBIN TOTAL: 0.8 mg/dL (ref 0.3–1.2)
BUN: 13 mg/dL (ref 6–20)
CHLORIDE: 103 mmol/L (ref 101–111)
CO2: 23 mmol/L (ref 22–32)
Calcium: 8.9 mg/dL (ref 8.9–10.3)
Creatinine, Ser: 0.8 mg/dL (ref 0.61–1.24)
GFR calc Af Amer: >60 mL/min (ref >60)
GFR calc non Af Amer: >60 mL/min (ref >60)
GLUCOSE: 147 mg/dL — AB (ref 65–99)
Potassium: 3.9 mmol/L (ref 3.5–5.1)
SODIUM: 137 mmol/L (ref 135–145)
Total Protein: 6.8 g/dL (ref 6.5–8.1)

## 2018-02-11 LAB — TROPONIN I: Troponin I: <0.03 ng/mL (ref <0.03)

## 2018-02-11 NOTE — ED Provider Notes (Signed)
[facility] Emergency Department Provider Note  Time seen: 4:15 PM  I have reviewed the triage vital signs and the nursing notes.   HISTORY  Chief Complaint Dizziness and Blood In Stools    HPI Lance Fox is a 27 y.o. male with no past medical history who presents to the emergency department for intermittent dizziness/lightheadedness.  According to the patient for the past 2-3 weeks he has been experiencing intermittent symptoms of lightheadedness.  Denies any chest pain, trouble breathing, abdominal pain, vomiting, diarrhea, dysuria.  He states for the past 2 days he has noticed a small amount of blood on the toilet paper when he wipes but states the stool itself appears overall normal.   History reviewed. No pertinent past medical history.  There are no active problems to display for this patient.   Past Surgical History:  Procedure Laterality Date  . TONSILLECTOMY      Prior to Admission medications   Medication Sig Start Date End Date Taking? Authorizing Provider  amoxicillin (AMOXIL) 500 MG capsule Take 1 capsule (500 mg total) by mouth 3 (three) times daily. 01/20/18   Enid DerryWagner, Ashley, PA-C  ketorolac (TORADOL) 10 MG tablet Take 1 tablet (10 mg total) by mouth every 6 (six) hours as needed. 01/20/18   Enid DerryWagner, Ashley, PA-C  penicillin v potassium (VEETID) 500 MG tablet Take 1 tablet (500 mg total) by mouth 4 (four) times daily. 05/22/17   Menshew, Charlesetta IvoryJenise V Bacon, PA-C  traMADol (ULTRAM) 50 MG tablet Take 1 tablet (50 mg total) by mouth 2 (two) times daily. 05/22/17   Menshew, Charlesetta IvoryJenise V Bacon, PA-C    No Known Allergies  Family History  Problem Relation Age of Onset  . Heart failure Other     Social History Social History   Tobacco Use  . Smoking status: Current Every Day Smoker    Packs/day: 1.00    Types: Cigarettes  . Smokeless tobacco: Never Used  Substance Use Topics  . Alcohol use: No    Frequency: Never  . Drug use: No     Review of Systems Constitutional: Negative for fever.  Intermittent lightheadedness. Eyes: Negative for visual complaints ENT: Negative for recent illness/congestion Cardiovascular: Negative for chest pain. Respiratory: Negative for shortness of breath. Gastrointestinal: Negative for abdominal pain, vomiting and diarrhea.  Small amount of bright red blood on toilet paper after bowel movement times 2 days.  Admits pain with initiation of bowel movement. Genitourinary: Negative for urinary compaints Musculoskeletal: Negative for musculoskeletal complaints Skin: Negative for skin complaints  Neurological: Negative for headache All other ROS negative  ____________________________________________   PHYSICAL EXAM:  VITAL SIGNS: ED Triage Vitals  Enc Vitals Group     BP 02/11/18 1524 121/66     Pulse Rate 02/11/18 1524 84     Resp 02/11/18 1524 15     Temp 02/11/18 1524 98.7 F (37.1 C)     Temp Source 02/11/18 1524 Oral     SpO2 02/11/18 1524 98 %     Weight 02/11/18 1521 175 lb (79.4 kg)     Height 02/11/18 1521 6\' 2"  (1.88 m)     Head Circumference --      Peak Flow --      Pain Score 02/11/18 1521 0     Pain Loc --      Pain Edu? --      Excl. in GC? --    Constitutional: Alert and oriented. Well appearing and in no distress. Eyes:  Normal exam ENT   Head: Normocephalic and atraumatic.   Mouth/Throat: Mucous membranes are moist. Cardiovascular: Normal rate, regular rhythm. No murmur Respiratory: Normal respiratory effort without tachypnea nor retractions. Breath sounds are clear Gastrointestinal: Soft and nontender. No distention.  Musculoskeletal: Nontender with normal range of motion in all extremities.  Neurologic:  Normal speech and language. No gross focal neurologic deficits  Skin:  Skin is warm, dry and intact.  Psychiatric: Mood and affect are normal.  ____________________________________________    EKG  EKG reviewed and interpreted by myself  shows normal sinus rhythm 82 bpm with a narrow QRS, normal axis, normal intervals, nonspecific ST changes.  No ST elevation.  ____________________________________________   INITIAL IMPRESSION / ASSESSMENT AND PLAN / ED COURSE  Pertinent labs & imaging results that were available during my care of the patient were reviewed by me and considered in my medical decision making (see chart for details).  Patient presents to the emergency department for intermittent lightheadedness over the past 2-3 weeks also with bright red blood on toilet paper for the past 2 days.  Differential would include anemia, GI bleed, hemorrhoidal bleeding, fissure, metabolic/electrolyte abnormality, infectious etiology.  Overall the patient appears well with a largely negative review of systems and overall very normal physical examination.  Lab work is overall reassuring with a slight leukocytosis.  Normal H&H.  Rectal exam shows no obvious hemorrhoids or fissures.  Stool is light brown in appearance but it is mildly guaiac positive.  Given the lightheadedness I have added on a troponin and we will perform an EKG as a precaution.  As the patient otherwise appears well with a normal exam, normal vitals anticipate likely discharge home with GI follow-up.  I discussed with the patient possibility of internal hemorrhoids or a fissure but would recommend he follow-up with a GI physician for further evaluation to rule out more concerning causes of bleeding such as oncologic processes.  EKG and troponin are normal.  We will discharge patient with GI referral.  Patient agreeable this plan of care. ____________________________________________   FINAL CLINICAL IMPRESSION(S) / ED DIAGNOSES  Rectal bleeding Lightheadedness    Minna Antis, MD 02/11/18 1641

## 2018-02-11 NOTE — ED Triage Notes (Signed)
Pt c/o dizziness for the last week and blood in stool that started yesterday

## 2018-10-15 ENCOUNTER — Emergency Department: Admission: EM | Admit: 2018-10-15 | Discharge: 2018-10-15 | Discharge status: Against Medical Advice | Payer: 59

## 2022-06-16 DIAGNOSIS — R69 Illness, unspecified: Secondary | ICD-10-CM | POA: Diagnosis not present

## 2022-06-16 DIAGNOSIS — F902 Attention-deficit hyperactivity disorder, combined type: Secondary | ICD-10-CM | POA: Diagnosis not present

## 2022-06-16 DIAGNOSIS — F331 Major depressive disorder, recurrent, moderate: Secondary | ICD-10-CM | POA: Diagnosis not present

## 2022-10-22 ENCOUNTER — Encounter: Payer: Self-pay | Admitting: Nurse Practitioner

## 2022-10-22 ENCOUNTER — Ambulatory Visit (INDEPENDENT_AMBULATORY_CARE_PROVIDER_SITE_OTHER): Payer: Commercial Managed Care - PPO | Admitting: Nurse Practitioner

## 2022-10-22 VITALS — BP 108/68 | HR 76 | Temp 98.2°F | Ht 71.65 in | Wt 141.1 lb

## 2022-10-22 DIAGNOSIS — F909 Attention-deficit hyperactivity disorder, unspecified type: Secondary | ICD-10-CM

## 2022-10-22 DIAGNOSIS — R634 Abnormal weight loss: Secondary | ICD-10-CM | POA: Diagnosis not present

## 2022-10-22 MED ORDER — MIRTAZAPINE 7.5 MG PO TABS: 7.5000 mg | ORAL_TABLET | Freq: Every day | ORAL | 0 refills | Status: DC

## 2022-10-22 NOTE — Assessment & Plan Note (Signed)
Chronic. Currently on Wellbutrin and Lexapro.  Does not feel like symptoms are well controlled.  Referral placed for patient to see psychiatry.  Follow up in 1 month. Call sooner if concerns arise.

## 2022-10-22 NOTE — Progress Notes (Signed)
BP 108/68   Pulse 76   Temp 98.2 F (36.8 C) (Oral)   Ht 5' 11.65" (1.82 m)   Wt 141 lb 1.6 oz (64 kg)   SpO2 100%   BMI 19.32 kg/m    Subjective:    Patient ID: Lance Fox, male    DOB: 1991/08/03, 31 y.o.   MRN: [id number]  HPI: Lance Fox is a 31 y.o. male  Chief Complaint  Patient presents with   Establish Care   ADHD    Patient would like to discuss ADHD medication , states he is on Bupropion but it is not helping much with his "attention"    Sleeping Problem     Patient states he has always had issues with sleep. Would also like to discuss this at today's OV.    Patient presents to clinic to establish care with new PCP.  Introduced to Publishing rights manager role and practice setting.  All questions answered.  Discussed provider/patient relationship and expectations.  Patient reports a history of ADHD, borderline personality disorder- not currently having a problem, depression and anxiety.  Denies negative thoughts or thoughts of harming himself.   Patient denies a history of: Hypertension, Elevated Cholesterol, Diabetes, Thyroid problems, Neurological problems, and Abdominal problems.   Patient states he is a NP that he has been seeing online for ADHD.  They have prescribed him Wellbutrin and Lexapro which he does not feel like are holding his attention.  He would like to try something else.  States his medications are also decreasing his appetite and feels like he is losing weight. Also having difficulty sleeping.    Active Ambulatory Problems    Diagnosis Date Noted   ADHD 10/22/2022   Weight loss 10/22/2022   Resolved Ambulatory Problems    Diagnosis Date Noted   No Resolved Ambulatory Problems   No Additional Past Medical History   Past Surgical History:  Procedure Laterality Date   TONSILLECTOMY     Family History  Problem Relation Age of Onset   Heart failure Other      Review of Systems  Constitutional:  Positive for appetite change and  unexpected weight change.  Psychiatric/Behavioral:  Positive for decreased concentration and sleep disturbance. Negative for suicidal ideas. The patient is nervous/anxious.     Per HPI unless specifically indicated above     Objective:    BP 108/68   Pulse 76   Temp 98.2 F (36.8 C) (Oral)   Ht 5' 11.65" (1.82 m)   Wt 141 lb 1.6 oz (64 kg)   SpO2 100%   BMI 19.32 kg/m   Wt Readings from Last 3 Encounters:  10/22/22 141 lb 1.6 oz (64 kg)  02/11/18 175 lb (79.4 kg)  01/20/18 175 lb (79.4 kg)    Physical Exam Vitals and nursing note reviewed.  Constitutional:      General: He is not in acute distress.    Appearance: Normal appearance. He is not ill-appearing, toxic-appearing or diaphoretic.  HENT:     Head: Normocephalic.     Right Ear: External ear normal.     Left Ear: External ear normal.     Nose: Nose normal. No congestion or rhinorrhea.     Mouth/Throat:     Mouth: Mucous membranes are moist.  Eyes:     General:        Right eye: No discharge.        Left eye: No discharge.     Extraocular Movements: Extraocular  movements intact.     Conjunctiva/sclera: Conjunctivae normal.     Pupils: Pupils are equal, round, and reactive to light.  Cardiovascular:     Rate and Rhythm: Normal rate and regular rhythm.     Heart sounds: No murmur heard. Pulmonary:     Effort: Pulmonary effort is normal. No respiratory distress.     Breath sounds: Normal breath sounds. No wheezing, rhonchi or rales.  Abdominal:     General: Abdomen is flat. Bowel sounds are normal.  Musculoskeletal:     Cervical back: Normal range of motion and neck supple.  Skin:    General: Skin is warm and dry.     Capillary Refill: Capillary refill takes less than 2 seconds.  Neurological:     General: No focal deficit present.     Mental Status: He is alert and oriented to person, place, and time.  Psychiatric:        Mood and Affect: Mood normal.        Behavior: Behavior normal.        Thought  Content: Thought content normal.        Judgment: Judgment normal.     Results for orders placed or performed during the hospital encounter of 02/11/18  Comprehensive metabolic panel  Result Value Ref Range   Sodium 137 135 - 145 mmol/L   Potassium 3.9 3.5 - 5.1 mmol/L   Chloride 103 101 - 111 mmol/L   CO2 23 22 - 32 mmol/L   Glucose, Bld 147 (H) 65 - 99 mg/dL   BUN 13 6 - 20 mg/dL   Creatinine, Ser 9.50 0.61 - 1.24 mg/dL   Calcium 8.9 8.9 - 93.2 mg/dL   Total Protein 6.8 6.5 - 8.1 g/dL   Albumin 4.0 3.5 - 5.0 g/dL   AST 34 15 - 41 U/L   ALT 28 17 - 63 U/L   Alkaline Phosphatase 62 38 - 126 U/L   Total Bilirubin 0.8 0.3 - 1.2 mg/dL   GFR calc non Af Amer >60 >60 mL/min   GFR calc Af Amer >60 >60 mL/min   Anion gap 11 5 - 15  CBC  Result Value Ref Range   WBC 11.9 (H) 3.8 - 10.6 K/uL   RBC 4.90 4.40 - 5.90 MIL/uL   Hemoglobin 14.0 13.0 - 18.0 g/dL   HCT 67.1 24.5 - 80.9 %   MCV 88.1 80.0 - 100.0 fL   MCH 28.5 26.0 - 34.0 pg   MCHC 32.4 32.0 - 36.0 g/dL   RDW 98.3 38.2 - 50.5 %   Platelets 239 150 - 440 K/uL  Troponin I  Result Value Ref Range   Troponin I <0.03 <0.03 ng/mL  Type and screen [facility]  Result Value Ref Range   ABO/RH(D) O POS    Antibody Screen NEG    Sample Expiration      02/14/2018 Performed at [facility], [address], [place], [place] [address]       Assessment & Plan:   Problem List Items Addressed This Visit       Other   ADHD - Primary    Chronic. Currently on Wellbutrin and Lexapro.  Does not feel like symptoms are well controlled.  Referral placed for patient to see psychiatry.  Follow up in 1 month. Call sooner if concerns arise.      Relevant Orders   Ambulatory referral to Psychiatry   Weight loss    Decreased appetite likely  secondary to medications. Will add Mirtazipine 7.5mg  daily.  Side effects and benefits of medication discussed during visit.  Discussed increasing protein intake and  drinking protein shakes to help with weight gain.  Follow up in 1 month.  Call sooner if concerns arise.         Follow up plan: Return in about 1 month (around 11/21/2022) for Physical and Fasting labs.

## 2022-10-22 NOTE — Assessment & Plan Note (Signed)
Decreased appetite likely secondary to medications. Will add Mirtazipine 7.5mg  daily.  Side effects and benefits of medication discussed during visit.  Discussed increasing protein intake and drinking protein shakes to help with weight gain.  Follow up in 1 month.  Call sooner if concerns arise.

## 2022-11-18 ENCOUNTER — Other Ambulatory Visit: Payer: Self-pay | Admitting: Nurse Practitioner

## 2022-11-18 NOTE — Telephone Encounter (Signed)
Requested Prescriptions  Pending Prescriptions Disp Refills   mirtazapine (REMERON) 7.5 MG tablet [Pharmacy Med Name: MIRTAZAPINE 7.5 MG TABLET] 30 tablet 0    Sig: TAKE 1 TABLET BY MOUTH AT BEDTIME.     Psychiatry: Antidepressants - mirtazapine Passed - 11/18/2022  1:36 AM      Passed - Valid encounter within last 6 months    Recent Outpatient Visits           3 weeks ago Attention deficit hyperactivity disorder (ADHD), unspecified ADHD type   [facility] Larae Grooms, NP       Future Appointments             In 1 week Larae Grooms, NP [facility]

## 2022-11-24 NOTE — Progress Notes (Unsigned)
There were no vitals taken for this visit.   Subjective:    Patient ID: Lance Fox, male    DOB: 1991-05-03, 31 y.o.   MRN: [id number]  HPI: Lance Fox is a 31 y.o. male presenting on 11/25/2022 for comprehensive medical examination. 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 They have prescribed him Wellbutrin and Lexapro which he does not feel like are holding his attention. He would like to try something else. States his medications are also decreasing his appetite and feels like he is losing weight. Also having difficulty sleeping.   Depression Screen done today and results listed below:     10/22/2022    1:49 PM  Depression screen PHQ 2/9  Decreased Interest 0  Down, Depressed, Hopeless 0  PHQ - 2 Score 0  Altered sleeping 3  Tired, decreased energy 2  Change in appetite 2  Feeling bad or failure about yourself  0  Trouble concentrating 3  Moving slowly or fidgety/restless 1  Suicidal thoughts 0  PHQ-9 Score 11  Difficult doing work/chores Not difficult at all    The patient {has/does not have:19849} a history of falls. I {did/did not:19850} complete a risk assessment for falls. A plan of care for falls {was/was not:19852} documented.   Past Medical History:  No past medical history on file.  Surgical History:  Past Surgical History:  Procedure Laterality Date   TONSILLECTOMY      Medications:  Current Outpatient Medications on File Prior to Visit  Medication Sig   buPROPion (WELLBUTRIN XL) 150 MG 24 hr tablet Take 150 mg by mouth daily.   escitalopram (LEXAPRO) 10 MG tablet Take 10 mg by mouth daily.   mirtazapine (REMERON) 7.5 MG tablet TAKE 1 TABLET BY MOUTH AT BEDTIME.   No current facility-administered medications on file prior to visit.     Allergies:  No Known Allergies  Social History:  Social History   Socioeconomic History   Marital status: Single    Spouse name: Not on file   Number of children: Not on file   Years of education: Not on file   Highest education level: Not on file  Occupational History   Not on file  Tobacco Use   Smoking status: Former    Packs/day: 1.00    Types: Cigarettes    Quit date: 2021    Years since quitting: 2.9   Smokeless tobacco: Never  Vaping Use   Vaping Use: Every day  Substance and Sexual Activity   Alcohol use: No   Drug use: Yes    Types: Marijuana    Comment: occasional   Sexual activity: Not Currently  Other Topics Concern   Not on file  Social History Narrative   Not on file   Social Determinants of Health   Financial Resource Strain: Not on file  Food Insecurity: Not on file  Transportation Needs: Not on file  Physical Activity: Not on file  Stress: Not on file  Social Connections: Not on file  Intimate Partner Violence: Not on file   Social History   Tobacco Use  Smoking Status Former   Packs/day: 1.00   Types: Cigarettes   Quit date: 2021   Years since quitting: 2.9  Smokeless Tobacco Never   Social History   Substance and Sexual Activity  Alcohol Use No  Family History:  Family History  Problem Relation Age of Onset   Heart failure Other     Past medical history, surgical history, medications, allergies, family history and social history reviewed with patient today and changes made to appropriate areas of the chart.   ROS All other ROS negative except what is listed above and in the HPI.      Objective:    There were no vitals taken for this visit.  Wt Readings from Last 3 Encounters:  10/22/22 141 lb 1.6 oz (64 kg)  02/11/18 175 lb (79.4 kg)  01/20/18 175 lb (79.4 kg)    Physical Exam  Results for orders placed or performed during the hospital encounter of 02/11/18  Comprehensive metabolic panel  Result Value Ref  Range   Sodium 137 135 - 145 mmol/L   Potassium 3.9 3.5 - 5.1 mmol/L   Chloride 103 101 - 111 mmol/L   CO2 23 22 - 32 mmol/L   Glucose, Bld 147 (H) 65 - 99 mg/dL   BUN 13 6 - 20 mg/dL   Creatinine, Ser 0.34 0.61 - 1.24 mg/dL   Calcium 8.9 8.9 - 74.2 mg/dL   Total Protein 6.8 6.5 - 8.1 g/dL   Albumin 4.0 3.5 - 5.0 g/dL   AST 34 15 - 41 U/L   ALT 28 17 - 63 U/L   Alkaline Phosphatase 62 38 - 126 U/L   Total Bilirubin 0.8 0.3 - 1.2 mg/dL   GFR calc non Af Amer >60 >60 mL/min   GFR calc Af Amer >60 >60 mL/min   Anion gap 11 5 - 15  CBC  Result Value Ref Range   WBC 11.9 (H) 3.8 - 10.6 K/uL   RBC 4.90 4.40 - 5.90 MIL/uL   Hemoglobin 14.0 13.0 - 18.0 g/dL   HCT 59.5 63.8 - 75.6 %   MCV 88.1 80.0 - 100.0 fL   MCH 28.5 26.0 - 34.0 pg   MCHC 32.4 32.0 - 36.0 g/dL   RDW 43.3 29.5 - 18.8 %   Platelets 239 150 - 440 K/uL  Troponin I  Result Value Ref Range   Troponin I <0.03 <0.03 ng/mL  Type and screen [facility]  Result Value Ref Range   ABO/RH(D) O POS    Antibody Screen NEG    Sample Expiration      02/14/2018 Performed at [facility], [address] [place], [place] [address]       Assessment & Plan:   Problem List Items Addressed This Visit   None    Discussed aspirin prophylaxis for myocardial infarction prevention and decision was {Blank single:19197::"it was not indicated","made to continue ASA","made to start ASA","made to stop ASA","that we recommended ASA, and patient refused"}  LABORATORY TESTING:  Health maintenance labs ordered today as discussed above.   The natural history of prostate cancer and ongoing controversy regarding screening and potential treatment outcomes of prostate cancer has been discussed with the patient. The meaning of a false positive PSA and a false negative PSA has been discussed. He indicates understanding of the limitations of this screening test and wishes *** to proceed with screening PSA  testing.   IMMUNIZATIONS:   - Tdap: Tetanus vaccination status reviewed: {tetanus status:315746}. - Influenza: {Blank single:19197::"Up to date","Administered today","Postponed to flu season","Refused","Given elsewhere"} - Pneumovax: {Blank single:19197::"Up to date","Administered today","Not applicable","Refused","Given elsewhere"} - Prevnar: {Blank single:19197::"Up to date","Administered today","Not applicable","Refused","Given elsewhere"} - COVID: {Blank single:19197::"Up to date","Administered today","Not applicable","Refused","Given elsewhere"} - HPV: {Blank  single:19197::"Up to date","Administered today","Not applicable","Refused","Given elsewhere"} - Shingrix vaccine: {Blank single:19197::"Up to date","Administered today","Not applicable","Refused","Given elsewhere"}  SCREENING: - Colonoscopy: {Blank single:19197::"Up to date","Ordered today","Not applicable","Refused","Done elsewhere"}  Discussed with patient purpose of the colonoscopy is to detect colon cancer at curable precancerous or early stages   - AAA Screening: {Blank single:19197::"Up to date","Ordered today","Not applicable","Refused","Done elsewhere"}  -Hearing Test: {Blank single:19197::"Up to date","Ordered today","Not applicable","Refused","Done elsewhere"}  -Spirometry: {Blank single:19197::"Up to date","Ordered today","Not applicable","Refused","Done elsewhere"}   PATIENT COUNSELING:    Sexuality: Discussed sexually transmitted diseases, partner selection, use of condoms, avoidance of unintended pregnancy  and contraceptive alternatives.   Advised to avoid cigarette smoking.  I discussed with the patient that most people either abstain from alcohol or drink within safe limits (<=14/week and <=4 drinks/occasion for males, <=7/weeks and <= 3 drinks/occasion for females) and that the risk for alcohol disorders and other health effects rises proportionally with the number of drinks per week and how often a drinker  exceeds daily limits.  Discussed cessation/primary prevention of drug use and availability of treatment for abuse.   Diet: Encouraged to adjust caloric intake to maintain  or achieve ideal body weight, to reduce intake of dietary saturated fat and total fat, to limit sodium intake by avoiding high sodium foods and not adding table salt, and to maintain adequate dietary potassium and calcium preferably from fresh fruits, vegetables, and low-fat dairy products.    stressed the importance of regular exercise  Injury prevention: Discussed safety belts, safety helmets, smoke detector, smoking near bedding or upholstery.   Dental health: Discussed importance of regular tooth brushing, flossing, and dental visits.   Follow up plan: NEXT PREVENTATIVE PHYSICAL DUE IN 1 YEAR. No follow-ups on file.

## 2022-11-25 ENCOUNTER — Ambulatory Visit (INDEPENDENT_AMBULATORY_CARE_PROVIDER_SITE_OTHER): Payer: 59 | Admitting: Nurse Practitioner

## 2022-11-25 ENCOUNTER — Encounter: Payer: Self-pay | Admitting: Nurse Practitioner

## 2022-11-25 VITALS — BP 98/60 | HR 69 | Temp 98.8°F | Ht 71.6 in | Wt 144.4 lb

## 2022-11-25 DIAGNOSIS — F902 Attention-deficit hyperactivity disorder, combined type: Secondary | ICD-10-CM

## 2022-11-25 DIAGNOSIS — Z136 Encounter for screening for cardiovascular disorders: Secondary | ICD-10-CM

## 2022-11-25 DIAGNOSIS — R69 Illness, unspecified: Secondary | ICD-10-CM | POA: Diagnosis not present

## 2022-11-25 DIAGNOSIS — Z1159 Encounter for screening for other viral diseases: Secondary | ICD-10-CM

## 2022-11-25 DIAGNOSIS — Z0000 Encounter for general adult medical examination without abnormal findings: Secondary | ICD-10-CM

## 2022-11-25 DIAGNOSIS — Z114 Encounter for screening for human immunodeficiency virus [HIV]: Secondary | ICD-10-CM

## 2022-11-25 LAB — URINALYSIS, ROUTINE W REFLEX MICROSCOPIC
Bilirubin, UA: NEGATIVE
Glucose, UA: NEGATIVE
Ketones, UA: NEGATIVE
Leukocytes,UA: NEGATIVE
Nitrite, UA: NEGATIVE
Protein,UA: NEGATIVE
RBC, UA: NEGATIVE
Specific Gravity, UA: 1.025 (ref 1.005–1.030)
Urobilinogen, Ur: 0.2 mg/dL (ref 0.2–1.0)
pH, UA: 5.5 (ref 5.0–7.5)

## 2022-11-25 NOTE — Assessment & Plan Note (Signed)
Not taking current medications.  Plans to see psychiatry for further evaluation and management.

## 2022-11-26 LAB — CBC WITH DIFFERENTIAL/PLATELET
Basophils Absolute: 0.1 10*3/uL (ref 0.0–0.2)
Basos: 1 %
EOS (ABSOLUTE): 0.1 10*3/uL (ref 0.0–0.4)
Eos: 1 %
Hematocrit: 47.6 % (ref 37.5–51.0)
Hemoglobin: 15.9 g/dL (ref 13.0–17.7)
Immature Grans (Abs): 0 10*3/uL (ref 0.0–0.1)
Immature Granulocytes: 0 %
Lymphocytes Absolute: 2.3 10*3/uL (ref 0.7–3.1)
Lymphs: 29 %
MCH: 28.8 pg (ref 26.6–33.0)
MCHC: 33.4 g/dL (ref 31.5–35.7)
MCV: 86 fL (ref 79–97)
Monocytes Absolute: 0.4 10*3/uL (ref 0.1–0.9)
Monocytes: 5 %
Neutrophils Absolute: 5 10*3/uL (ref 1.4–7.0)
Neutrophils: 64 %
Platelets: 222 10*3/uL (ref 150–450)
RBC: 5.52 x10E6/uL (ref 4.14–5.80)
RDW: 12 % (ref 11.6–15.4)
WBC: 7.8 10*3/uL (ref 3.4–10.8)

## 2022-11-26 LAB — LIPID PANEL
Chol/HDL Ratio: 2.6 ratio (ref 0.0–5.0)
Cholesterol, Total: 151 mg/dL (ref 100–199)
HDL: 59 mg/dL (ref >39)
LDL Chol Calc (NIH): 82 mg/dL (ref 0–99)
Triglycerides: 45 mg/dL (ref 0–149)
VLDL Cholesterol Cal: 10 mg/dL (ref 5–40)

## 2022-11-26 LAB — COMPREHENSIVE METABOLIC PANEL
ALT: 29 IU/L (ref 0–44)
AST: 19 IU/L (ref 0–40)
Albumin/Globulin Ratio: 2.5 — ABNORMAL HIGH (ref 1.2–2.2)
Albumin: 4.9 g/dL (ref 4.3–5.2)
Alkaline Phosphatase: 63 IU/L (ref 44–121)
BUN/Creatinine Ratio: 12 (ref 9–20)
BUN: 12 mg/dL (ref 6–20)
Bilirubin Total: 1.5 mg/dL — ABNORMAL HIGH (ref 0.0–1.2)
CO2: 24 mmol/L (ref 20–29)
Calcium: 9.8 mg/dL (ref 8.7–10.2)
Chloride: 101 mmol/L (ref 96–106)
Creatinine, Ser: 0.97 mg/dL (ref 0.76–1.27)
Globulin, Total: 2 g/dL (ref 1.5–4.5)
Glucose: 98 mg/dL (ref 70–99)
Potassium: 4 mmol/L (ref 3.5–5.2)
Sodium: 141 mmol/L (ref 134–144)
Total Protein: 6.9 g/dL (ref 6.0–8.5)
eGFR: 108 mL/min/{1.73_m2} (ref >59)

## 2022-11-26 LAB — TSH: TSH: 1.91 u[IU]/mL (ref 0.450–4.500)

## 2022-11-26 LAB — HEPATITIS C ANTIBODY: Hep C Virus Ab: NONREACTIVE

## 2022-11-26 LAB — HIV ANTIBODY (ROUTINE TESTING W REFLEX): HIV Screen 4th Generation wRfx: NONREACTIVE

## 2022-11-26 NOTE — Progress Notes (Signed)
Hi Lance Fox. It was nice to see you yesterday.  Your lab work looks good.  No concerns at this time. Continue with your current medication regimen.  Follow up as discussed.  Please let me know if you have any questions.

## 2023-02-09 ENCOUNTER — Encounter: Payer: Self-pay | Admitting: Psychiatry

## 2023-02-09 ENCOUNTER — Ambulatory Visit (INDEPENDENT_AMBULATORY_CARE_PROVIDER_SITE_OTHER): Payer: BLUE CROSS/BLUE SHIELD | Admitting: Psychiatry

## 2023-02-09 VITALS — BP 114/72 | HR 80 | Temp 97.7°F | Ht 73.5 in | Wt 154.8 lb

## 2023-02-09 DIAGNOSIS — F1290 Cannabis use, unspecified, uncomplicated: Secondary | ICD-10-CM | POA: Diagnosis not present

## 2023-02-09 DIAGNOSIS — F1021 Alcohol dependence, in remission: Secondary | ICD-10-CM | POA: Diagnosis not present

## 2023-02-09 DIAGNOSIS — R41840 Attention and concentration deficit: Secondary | ICD-10-CM

## 2023-02-09 DIAGNOSIS — G4700 Insomnia, unspecified: Secondary | ICD-10-CM | POA: Diagnosis not present

## 2023-02-09 NOTE — Patient Instructions (Signed)
Insomnia Insomnia is a sleep disorder that makes it difficult to fall asleep or stay asleep. Insomnia can cause fatigue, low energy, difficulty concentrating, mood swings, and poor performance at work or school. There are three different ways to classify insomnia: Difficulty falling asleep. Difficulty staying asleep. Waking up too early in the morning. Any type of insomnia can be long-term (chronic) or short-term (acute). Both are common. Short-term insomnia usually lasts for 3 months or less. Chronic insomnia occurs at least three times a week for longer than 3 months. What are the causes? Insomnia may be caused by another condition, situation, or substance, such as: Having certain mental health conditions, such as anxiety and depression. Using caffeine, alcohol, tobacco, or drugs. Having gastrointestinal conditions, such as gastroesophageal reflux disease (GERD). Having certain medical conditions. These include: Asthma. Alzheimer's disease. Stroke. Chronic pain. An overactive thyroid gland (hyperthyroidism). Other sleep disorders, such as restless legs syndrome and sleep apnea. Menopause. Sometimes, the cause of insomnia may not be known. What increases the risk? Risk factors for insomnia include: Gender. Females are affected more often than males. Age. Insomnia is more common as people get older. Stress and certain medical and mental health conditions. Lack of exercise. Having an irregular work schedule. This may include working night shifts and traveling between different time zones. What are the signs or symptoms? If you have insomnia, the main symptom is having trouble falling asleep or having trouble staying asleep. This may lead to other symptoms, such as: Feeling tired or having low energy. Feeling nervous about going to sleep. Not feeling rested in the morning. Having trouble concentrating. Feeling irritable, anxious, or depressed. How is this diagnosed? This condition  may be diagnosed based on: Your symptoms and medical history. Your health care provider may ask about: Your sleep habits. Any medical conditions you have. Your mental health. A physical exam. How is this treated? Treatment for insomnia depends on the cause. Treatment may focus on treating an underlying condition that is causing the insomnia. Treatment may also include: Medicines to help you sleep. Counseling or therapy. Lifestyle adjustments to help you sleep better. Follow these instructions at home: Eating and drinking  Limit or avoid alcohol, caffeinated beverages, and products that contain nicotine and tobacco, especially close to bedtime. These can disrupt your sleep. Do not eat a large meal or eat spicy foods right before bedtime. This can lead to digestive discomfort that can make it hard for you to sleep. Sleep habits  Keep a sleep diary to help you and your health care provider figure out what could be causing your insomnia. Write down: When you sleep. When you wake up during the night. How well you sleep and how rested you feel the next day. Any side effects of medicines you are taking. What you eat and drink. Make your bedroom a dark, comfortable place where it is easy to fall asleep. Put up shades or blackout curtains to block light from outside. Use a white noise machine to block noise. Keep the temperature cool. Limit screen use before bedtime. This includes: Not watching TV. Not using your smartphone, tablet, or computer. Stick to a routine that includes going to bed and waking up at the same times every day and night. This can help you fall asleep faster. Consider making a quiet activity, such as reading, part of your nighttime routine. Try to avoid taking naps during the day so that you sleep better at night. Get out of bed if you are still awake after   15 minutes of trying to sleep. Keep the lights down, but try reading or doing a quiet activity. When you feel  sleepy, go back to bed. General instructions Take over-the-counter and prescription medicines only as told by your health care provider. Exercise regularly as told by your health care provider. However, avoid exercising in the hours right before bedtime. Use relaxation techniques to manage stress. Ask your health care provider to suggest some techniques that may work well for you. These may include: Breathing exercises. Routines to release muscle tension. Visualizing peaceful scenes. Make sure that you drive carefully. Do not drive if you feel very sleepy. Keep all follow-up visits. This is important. Contact a health care provider if: You are tired throughout the day. You have trouble in your daily routine due to sleepiness. You continue to have sleep problems, or your sleep problems get worse. Get help right away if: You have thoughts about hurting yourself or someone else. Get help right away if you feel like you may hurt yourself or others, or have thoughts about taking your own life. Go to your nearest emergency room or: Call 911. Call the [facility] at [phone] or [phone]. This is open 24 hours a day. Text the Crisis Text Line at (925)435-1588. Summary Insomnia is a sleep disorder that makes it difficult to fall asleep or stay asleep. Insomnia can be long-term (chronic) or short-term (acute). Treatment for insomnia depends on the cause. Treatment may focus on treating an underlying condition that is causing the insomnia. Keep a sleep diary to help you and your health care provider figure out what could be causing your insomnia. This information is not intended to replace advice given to you by your health care provider. Make sure you discuss any questions you have with your health care provider. Document Revised: 11/04/2021 Document Reviewed: 11/04/2021 Elsevier Patient Education  [place].   Preventing Marijuana Misuse, Adult Marijuana is a mixture  of the dried leaves and flowers of the hemp plant Cannabis sativa. The plant's active ingredients (cannabinoids) change the chemistry of the brain. If you smoke or eat marijuana, you will have changes in the way you think, feel, and behave. In some cases, marijuana is prescribed by a health care provider (medical marijuana) for temporary relief of a medical condition. It can have medical effects, such as pain relief or reduced nausea. Many people use marijuana for recreational purposes because it helps them relax and puts them in a good mood (marijuana high). How can marijuana use affect me? Effects of marijuana are mental and physical. It can have negative effects, both short-term and long-term. When used for recreation, marijuana is often used in higher doses and for longer periods. High doses of marijuana can cause unpleasant side effects. It is also possible to become addicted to marijuana. Short-term effects of marijuana use include: Physical effects: Increased heart rate. Slowed movement, coordination, and reaction time. This can lead to accidents. Red eyes and changes in vision. Increased hunger. Coughing. Mental effects: Changes in mood and perception, such as an altered sense of time. Poor memory, decision-making, and problem-solving. Being very sleepy. High doses of marijuana can cause: Panic. Anxiety. Confusion. Severe dizziness. Vomiting. Hallucinations. This means you see, hear, taste, smell, or feel things that are not real. What can increase my risk? You may be at a higher risk of marijuana misuse if you: Have a family history of drug addiction. Often use other substances, like alcohol. Began using marijuana at an early  age. Have had past issues with substance use disorder. What can happen if I keep using marijuana? If you use marijuana for a long time, it can have long-term effects such as: Higher risk of health problems, including: Lung and breathing  problems. Possible higher risk of heart and blood vessel problems. Possible higher risk of lung or testicular cancer. A decrease in the body's disease-fighting system (immune system). Mental and physical dependence (addiction). Hallucinations or periods of false perceptions or beliefs (paranoia). Worsening of mental illness or onset of new mental illness. This can include anxiety, depression, or suicidal thoughts. Trouble maintaining healthy relationships. Poor memory and trouble concentrating and learning. This can result in lower intelligence and poor performance at school or work. Higher risk of using other substances like alcohol, nicotine, and illegal drugs. A condition called cannabinoid hyperemesis syndrome. This causes repeated episodes of intense abdominal pain, nausea, and vomiting. Quitting marijuana after using it for a long time can cause withdrawal symptoms, such as headache, shakiness, and cravings for the drug. What actions can I take to stop using marijuana? If you are not physically or mentally dependent on marijuana, you should be able to stop using it on your own. Taking these actions may help: Find healthy ways to cope with stress, such as exercise, meditation, or spending time with family and friends. Talk with your health care provider about how you feel and how to cope with stress. Spend time with people who do not use marijuana. Or, make new friends who do not use marijuana. Do something else instead of using marijuana. Exercise, start a new hobby, or take part in activities with others. Do not be afraid to say no if someone offers you marijuana. Speak up about why you do not want to use drugs. You can be a positive role model for others. If you cannot stop on your own, ask your health care provider for help. Treatment for marijuana misuse may include: Cognitive-behavioral therapy (psychotherapy). This may include individual or group therapy. Joining a support  group. Treating other medical, behavioral, or mental health conditions that you may have. What are the effects of vaping? Vaping devices, such as e-cigarettes, may be used to smoke marijuana products. Vaping involves inhaling an aerosol or vapor made from a liquid or dry substance that is heated in the device. People who vape may also use marijuana concentrates. Vaping these products can result in more side effects than the use of plant marijuana, such as: Inhaling toxic substances, including metals and volatile organic compounds (VOCs) from the devices and solvents used. Inhaling heated air. This has been shown to burn lung tissue and cause lung diseases and lung cancer. Increased risk of mental illness, such as psychosis and schizophrenia. Where to find more information Centers for Disease Control and Prevention Librarian, academic): StoreMirror.com.cy [employer] of Health: VoipGurus.fr Substance Abuse and Mental Health Services Administration: SamedayNews.com.cy Contact a health care provider if: You want to stop using marijuana but you cannot. You have withdrawal symptoms when you try to stop using marijuana. You are using marijuana every day or with other drugs. You need to use more and more marijuana to get the same desired effect. You have anxiety or depression. You have hallucinations or paranoia. Marijuana use is interfering with your relationships or your ability to function at school or work. You have severe stomach pain, nausea, and vomiting. Get help right away if: You have chest pain or shortness of breath. You have fast or irregular heartbeats (palpitations). These symptoms  may be an emergency. Get help right away. Call 911. Do not wait to see if the symptoms will go away. Do not drive yourself to the hospital. This information is not intended to replace advice given to you by your health care provider. Make sure you discuss any questions you have with your health care provider. Document Revised:  05/01/2022 Document Reviewed: 05/01/2022 Elsevier Patient Education  [place].

## 2023-02-09 NOTE — Progress Notes (Unsigned)
Psychiatric Initial Adult Assessment   Patient Identification: Lance Fox MRN:  [id number] Date of Evaluation:  02/09/2023 Referral Source: Jon Billings NP Chief Complaint:   Chief Complaint  Patient presents with   Establish Care   Attention and concentration deficit   Insomnia   Visit Diagnosis:    ICD-10-CM   1. Attention and concentration deficit  R41.840 Ambulatory referral to Psychology    2. Insomnia, unspecified type  G47.00     3. Alcohol use disorder, moderate, in sustained remission (HCC)  F10.21     4. Long term current use of cannabis  F12.90       History of Present Illness:  Lance Fox is a 32 year old Caucasian male, single, currently employed part-time, lives in [place], has a history of attention and focus deficit, cannabis use, alcoholism and cocaine use in remission, was evaluated in the office today.  Patient today appeared to be alert, oriented to person place time and situation.  Patient reports he has a previous diagnosis of ADHD-diagnosed by a provider on Teledoc in October 2023.  Was started on medications like Wellbutrin.  He may also have been started on other medications however does not remember all the names.  Patient reports he did not believe the Wellbutrin was effective and hence he stopped taking it.  Patient currently is not on any psychotropics.  Reports he struggles with attention, concentration problem, inability to stay on task, lacks organizational skills, has trouble keeping up with projects, assignments, making careless mistakes, forgetting appointments.  Reports he has had ADHD symptoms since elementary school.  There was a stigma surrounding it and hence his mother decided not to start him on medications as a child.  Patient reports he is currently interested in getting tested.  Patient reports his attention and focus issues does make him anxious and he is often restless fidgety.  However he does not believe he has any  other significant anxiety symptoms.  Patient denies any sadness, hopelessness.  Patient does report sleep problems however reports it is mostly because of his work schedule and also due to not having a good sleep hygiene.  Reports he has had trouble with mood regulation in the past.  May have had self-injurious behavior last one in 2017.  Patient however reports that was not to kill himself and may have been a cry for attention.  Patient reports at that time he slit his wrist with a razor and went to the emergency department where it was taken care of.  Patient currently denies any suicidality.  Patient does report use of delta 9 , vapes it .  Patient also reports a history of heavy alcohol use, brief period of cocaine abuse in the past.  Currently denies it.  Patient denies any homicidality, perceptual disturbances.  Patient denies any manic or hypomanic symptoms.   Associated Signs/Symptoms: Depression Symptoms:  anxiety, decreased appetite, (Hypo) Manic Symptoms:   Denies Anxiety Symptoms:   Anxiety on and off Psychotic Symptoms:   Denies PTSD Symptoms: Denies  Past Psychiatric History: Patient denies inpatient behavioral health admissions.  Patient was under the care of a provider at [facility] last year. Does report a history of self-injurious behavior-a few times, last 1 was 2017 when he went to the emergency department after cutting  his forearm-reports it was not a suicide attempt.   Previous Psychotropic Medications: Yes per review of notes per primary care provider-10/22/2022-Ms.Holdsworth -patient was on Wellbutrin, Lexapro, mirtazapine was added.  Patient however has been  noncompliant.  Substance Abuse History in the last 12 months:  Yes.  Currently uses delta 9 vapes it . Does report a history of heavy alcoholism, first use was at the age of 14, however later on started using heavily-beer-3 to 4 ,40 ounce when he used, also may have used hard liquor in the past.  As noted  -DUI in the past. Went into rehab in [place] for 1 month residential, 1 month outpatient in 2017.  Has been sober since January 2020. Brief use of cocaine in the past does not elaborate the extent of it.  Consequences of Substance Abuse: Legal Consequences:  DUI x 1 in the past.  This may have been in 2016.  Past Medical History:  Past Medical History:  Diagnosis Date   ADHD (attention deficit hyperactivity disorder)    Anxiety     Past Surgical History:  Procedure Laterality Date   TONSILLECTOMY      Family Psychiatric History: As noted below.  Family History:  Family History  Problem Relation Age of Onset   ADD / ADHD Mother    Anxiety disorder Mother    Heart failure Other     Social History:   Social History   Socioeconomic History   Marital status: Single    Spouse name: Not on file   Number of children: Not on file   Years of education: Not on file   Highest education level: Some college, no degree  Occupational History   Not on file  Tobacco Use   Smoking status: Former    Packs/day: 1.00    Types: Cigarettes    Quit date: 2021    Years since quitting: 3.1   Smokeless tobacco: Never  Vaping Use   Vaping Use: Every day   Substances: Nicotine, Mixture of cannabinoids  Substance and Sexual Activity   Alcohol use: No   Drug use: Yes    Types: Marijuana    Comment: occasional   Sexual activity: Not Currently  Other Topics Concern   Not on file  Social History Narrative   Not on file   Social Determinants of Health   Financial Resource Strain: Not on file  Food Insecurity: Not on file  Transportation Needs: Not on file  Physical Activity: Not on file  Stress: Not on file  Social Connections: Not on file    Additional Social History: Patient was born and raised in [place].  Raised by both parents.  Patient has 3 sisters, 1 brother.  Patient graduated high school, went into [employer] however did not complete college.  Patient used to  work at [employer] in the past.  However currently doing [employer].  Patient is single.  Denies having children.  Currently lives in [place].  His mother lives with him.  Patient does report a history of DUI as noted above-none pending.  Reports he is agnostic.  Allergies:  No Known Allergies  Metabolic Disorder Labs: No results found for: "HGBA1C", "MPG" No results found for: "PROLACTIN" Lab Results  Component Value Date   CHOL 151 11/25/2022   TRIG 45 11/25/2022   HDL 59 11/25/2022   CHOLHDL 2.6 11/25/2022   LDLCALC 82 11/25/2022   Lab Results  Component Value Date   TSH 1.910 11/25/2022    Therapeutic Level Labs: No results found for: "LITHIUM" No results found for: "CBMZ" No results found for: "VALPROATE"  Current Medications: No current outpatient medications on file.   No current facility-administered medications for this visit.  Musculoskeletal: Strength & Muscle Tone: within normal limits Gait & Station: normal Patient leans: N/A  Psychiatric Specialty Exam: Review of Systems  Psychiatric/Behavioral:  Positive for decreased concentration and sleep disturbance.   All other systems reviewed and are negative.   Blood pressure 114/72, pulse 80, temperature 97.7 F (36.5 C), temperature source Skin, height 6' 1.5" (1.867 m), weight 154 lb 12.8 oz (70.2 kg).Body mass index is 20.15 kg/m.  General Appearance: Casual  Eye Contact:  Good  Speech:  Clear and Coherent  Volume:  Normal  Mood:  Euthymic  Affect:  Congruent  Thought Process:  Goal Directed and Descriptions of Associations: Intact  Orientation:  Full (Time, Place, and Person)  Thought Content:  Logical  Suicidal Thoughts:  No  Homicidal Thoughts:  No  Memory:  Immediate;   Fair Recent;   Fair Remote;   Fair  Judgement:  Fair  Insight:  Fair  Psychomotor Activity:  Normal  Concentration:  Concentration: Fair and Attention Span: Fair  Recall:  [employer] [place]: Fair   Akathisia:  No  Handed:  Right  AIMS (if indicated):  not done  Assets:  Communication Skills Desire for Improvement Housing Social Support Transportation  ADL's:  Intact  Cognition: WNL  Sleep:  Poor   Screenings: GAD-7    Flowsheet Row Office Visit from 02/09/2023 in [place] Office Visit from 11/25/2022 in [place] Office Visit from 10/22/2022 in [place]  Total GAD-7 Score '4 1 2      '$ PHQ2-9    [place] Office Visit from 02/09/2023 in [place] Office Visit from 11/25/2022 in [place] Office Visit from 10/22/2022 in [place]  PHQ-2 Total Score 2 0 0  PHQ-9 Total Score '10 9 11      '$ [place] Office Visit from 02/09/2023 in [place] CATEGORY Low Risk       Assessment and Plan: JARAN MENCH is a 32 year old Caucasian male with history of attention and focus deficit, insomnia, use of cannabis, history of alcoholism in remission, brief use of cocaine in remission was evaluated in office today.  Patient currently struggling with attention and focus deficit and is interested in medications for the same.  Discussed plan as noted below.   Plan Attention and concentration deficit-unstable Will refer patient for neuropsychological testing.  Will refer to tailored brain health.  I have communicated with staff to refer this patient since Dr. Mallie Mussel currently does not accept his health insurance plan. Patient advised to start CBT in the meantime.  Insomnia unspecified-unstable Discussed sleep hygiene techniques. Will consider starting a sleep medication in the future as needed  Alcohol use disorder in remission Will monitor closely  Long-term use of cannabis-unstable Patient advised to stop using cannabis. Will  consider getting a urine drug screen prior to initiation of psychotropics in the future.  I have reviewed labs-TSH-11/25/2022-within normal limits.  Follow-up in clinic after neuropsychological testing or sooner as needed.    Collaboration of Care: Other I have referred patient for neuropsychological testing to rule out ADHD.  Patient/Guardian was advised Release of Information must be obtained prior to any record release in order to collaborate their care with an outside provider. Patient/Guardian was advised if they have not already done so to contact the registration department to sign all necessary forms in  order for [place] to release information regarding their care.   Consent: Patient/Guardian gives verbal consent for treatment and assignment of benefits for services provided during this visit. Patient/Guardian expressed understanding and agreed to proceed.   This note was generated in part or whole with voice recognition software. Voice recognition is usually quite accurate but there are transcription errors that can and very often do occur. I apologize for any typographical errors that were not detected and corrected.     Ursula Alert, MD 3/5/20249:37 AM

## 2023-05-27 ENCOUNTER — Ambulatory Visit: Payer: BLUE CROSS/BLUE SHIELD | Admitting: Nurse Practitioner

## 2023-11-01 ENCOUNTER — Emergency Department (HOSPITAL_COMMUNITY)
Admission: EM | Admit: 2023-11-01 | Discharge: 2023-11-02 | Disposition: A | Discharge status: Other Acute Inpatient Hospital | Payer: BLUE CROSS/BLUE SHIELD | Source: Home / Self Care | Attending: Student in an Organized Health Care Education/Training Program | Admitting: Student in an Organized Health Care Education/Training Program

## 2023-11-01 ENCOUNTER — Emergency Department: Payer: BLUE CROSS/BLUE SHIELD

## 2023-11-01 DIAGNOSIS — F1210 Cannabis abuse, uncomplicated: Secondary | ICD-10-CM | POA: Insufficient documentation

## 2023-11-01 DIAGNOSIS — R4182 Altered mental status, unspecified: Secondary | ICD-10-CM | POA: Insufficient documentation

## 2023-11-01 DIAGNOSIS — F1310 Sedative, hypnotic or anxiolytic abuse, uncomplicated: Secondary | ICD-10-CM | POA: Insufficient documentation

## 2023-11-01 DIAGNOSIS — F19951 Other psychoactive substance use, unspecified with psychoactive substance-induced psychotic disorder with hallucinations: Secondary | ICD-10-CM | POA: Diagnosis not present

## 2023-11-01 DIAGNOSIS — F22 Delusional disorders: Secondary | ICD-10-CM | POA: Insufficient documentation

## 2023-11-01 DIAGNOSIS — F19151 Other psychoactive substance abuse with psychoactive substance-induced psychotic disorder with hallucinations: Secondary | ICD-10-CM | POA: Insufficient documentation

## 2023-11-01 DIAGNOSIS — G4700 Insomnia, unspecified: Secondary | ICD-10-CM | POA: Diagnosis present

## 2023-11-01 DIAGNOSIS — F1911 Other psychoactive substance abuse, in remission: Secondary | ICD-10-CM | POA: Insufficient documentation

## 2023-11-01 DIAGNOSIS — Z87891 Personal history of nicotine dependence: Secondary | ICD-10-CM | POA: Insufficient documentation

## 2023-11-01 DIAGNOSIS — F333 Major depressive disorder, recurrent, severe with psychotic symptoms: Secondary | ICD-10-CM | POA: Diagnosis not present

## 2023-11-01 LAB — URINE DRUG SCREEN, QUALITATIVE (ARMC ONLY)
Amphetamines, Ur Screen: NOT DETECTED
Barbiturates, Ur Screen: NOT DETECTED
Benzodiazepine, Ur Scrn: POSITIVE — AB
Cannabinoid 50 Ng, Ur ~~LOC~~: POSITIVE — AB
Cocaine Metabolite,Ur ~~LOC~~: NOT DETECTED
MDMA (Ecstasy)Ur Screen: NOT DETECTED
Methadone Scn, Ur: NOT DETECTED
Opiate, Ur Screen: NOT DETECTED
Phencyclidine (PCP) Ur S: NOT DETECTED
Tricyclic, Ur Screen: NOT DETECTED

## 2023-11-01 LAB — COMPREHENSIVE METABOLIC PANEL
ALT: 19 U/L (ref 0–44)
AST: 26 U/L (ref 15–41)
Albumin: 5.3 g/dL — ABNORMAL HIGH (ref 3.5–5.0)
Alkaline Phosphatase: 51 U/L (ref 38–126)
Anion gap: 14 (ref 5–15)
BUN: 15 mg/dL (ref 6–20)
CO2: 20 mmol/L — ABNORMAL LOW (ref 22–32)
Calcium: 9.7 mg/dL (ref 8.9–10.3)
Chloride: 100 mmol/L (ref 98–111)
Creatinine, Ser: 1.1 mg/dL (ref 0.61–1.24)
GFR, Estimated: >60 mL/min (ref >60)
Glucose, Bld: 140 mg/dL — ABNORMAL HIGH (ref 70–99)
Potassium: 3.8 mmol/L (ref 3.5–5.1)
Sodium: 134 mmol/L — ABNORMAL LOW (ref 135–145)
Total Bilirubin: 3.2 mg/dL — ABNORMAL HIGH (ref <1.2)
Total Protein: 8.1 g/dL (ref 6.5–8.1)

## 2023-11-01 LAB — CBC WITH DIFFERENTIAL/PLATELET
Abs Immature Granulocytes: 0.02 10*3/uL (ref 0.00–0.07)
Basophils Absolute: 0.1 10*3/uL (ref 0.0–0.1)
Basophils Relative: 1 %
Eosinophils Absolute: 0 10*3/uL (ref 0.0–0.5)
Eosinophils Relative: 0 %
HCT: 53.7 % — ABNORMAL HIGH (ref 39.0–52.0)
Hemoglobin: 17.8 g/dL — ABNORMAL HIGH (ref 13.0–17.0)
Immature Granulocytes: 0 %
Lymphocytes Relative: 27 %
Lymphs Abs: 2.4 10*3/uL (ref 0.7–4.0)
MCH: 28.4 pg (ref 26.0–34.0)
MCHC: 33.1 g/dL (ref 30.0–36.0)
MCV: 85.6 fL (ref 80.0–100.0)
Monocytes Absolute: 0.6 10*3/uL (ref 0.1–1.0)
Monocytes Relative: 6 %
Neutro Abs: 6 10*3/uL (ref 1.7–7.7)
Neutrophils Relative %: 66 %
Platelets: 281 10*3/uL (ref 150–400)
RBC: 6.27 MIL/uL — ABNORMAL HIGH (ref 4.22–5.81)
RDW: 12.2 % (ref 11.5–15.5)
WBC: 9 10*3/uL (ref 4.0–10.5)
nRBC: 0 % (ref 0.0–0.2)

## 2023-11-01 LAB — CBG MONITORING, ED: Glucose-Capillary: 131 mg/dL — ABNORMAL HIGH (ref 70–99)

## 2023-11-01 MED ORDER — LORAZEPAM 0.5 MG PO TABS: 0.5000 mg | ORAL_TABLET | Freq: Three times a day (TID) | ORAL | Status: DC | PRN

## 2023-11-01 MED ORDER — MIDAZOLAM HCL 2 MG/2ML IJ SOLN
1.0000 mg | Freq: Once | INTRAMUSCULAR | Status: AC
  Administered 2023-11-01: 1 mg via INTRAVENOUS
  Filled 2023-11-01: qty 2

## 2023-11-01 MED ORDER — SODIUM CHLORIDE 0.9 % IV BOLUS
1000.0000 mL | Freq: Once | INTRAVENOUS | Status: AC
  Administered 2023-11-01: 1000 mL via INTRAVENOUS

## 2023-11-01 MED ORDER — OLANZAPINE 10 MG PO TABS
10.0000 mg | ORAL_TABLET | Freq: Every day | ORAL | Status: DC
Start: 2023-11-01 — End: 2023-11-02
  Administered 2023-11-01: 10 mg via ORAL
  Filled 2023-11-01: qty 1

## 2023-11-01 NOTE — ED Notes (Signed)
Pt. Alert and oriented, warm and dry, in no distress. Pt. Denies SI, HI, and AVH. Patient is sloe with responses with pressured speech. Pt. Encouraged to let nursing staff know of any concerns or needs.  ENVIRONMENTAL ASSESSMENT Potentially harmful objects out of patient reach: Yes.   Personal belongings secured: Yes.   Patient dressed in hospital provided attire only: Yes.   Plastic bags out of patient reach: Yes.   Patient care equipment (cords, cables, call bells, lines, and drains) shortened, removed, or accounted for: Yes.   Equipment and supplies removed from bottom of stretcher: Yes.   Potentially toxic materials out of patient reach: Yes.   Sharps container removed or out of patient reach: Yes.

## 2023-11-01 NOTE — ED Notes (Signed)
Pt removed monitoring equipment and also removed IV. Placed pt back on monitor. Primary RN informed. Urine sample sent.

## 2023-11-01 NOTE — ED Notes (Addendum)
Pt belongings:  X2 bLack socks Tye die hoodie Black sneakers Wallace Cullens and black sweat pants X1 pair of underwear Black phone Black hat Black wallet-- no cash in wallet just credit/debit cards  Pt belongings secured in a white pt bag. Pt belongings taken by family member. Pt does have a pair of glasses that he is keeping on and a piece of paper with numbers on it.

## 2023-11-01 NOTE — ED Provider Notes (Signed)
[facility] Provider Note    Event Date/Time   First MD Initiated Contact with Patient 11/01/23 [phone]     (approximate)   History   Altered Mental Status   HPI  Lance Fox is a 32 y.o. male history of ADHD, and anxiety presents to the ER for evaluation of severe panic attack occurring while he was leaving the house today.  He denies any numbness or tingling.  Appears very anxious.  Denies any chest pain or shortness of breath.     Physical Exam   Triage Vital Signs: ED Triage Vitals  Encounter Vitals Group     BP 11/01/23 1000 (!) 146/99     Systolic BP Percentile --      Diastolic BP Percentile --      Pulse Rate 11/01/23 1000 (!) 116     Resp 11/01/23 1000 (!) 24     Temp 11/01/23 1000 97.6 F (36.4 C)     Temp Source 11/01/23 1000 Oral     SpO2 11/01/23 1000 98 %     Weight --      Height --      Head Circumference --      Peak Flow --      Pain Score 11/01/23 1003 0     Pain Loc --      Pain Education --      Exclude from Growth Chart --     Most recent vital signs: Vitals:   11/01/23 1300 11/01/23 1315  BP: 118/73   Pulse: (!) 102 84  Resp: (!) 22 (!) 30  Temp:    SpO2: 100% 99%     Constitutional: Alert  Eyes: Conjunctivae are normal.  Head: Atraumatic. Nose: No congestion/rhinnorhea. Mouth/Throat: Mucous membranes are moist.   Neck: Painless ROM.  Cardiovascular:   Good peripheral circulation. Respiratory: Normal respiratory effort.  No retractions.  Gastrointestinal: Soft and nontender.  Musculoskeletal:  no deformity Neurologic:  MAE spontaneously. No gross focal neurologic deficits are appreciated.  Skin:  Skin is warm, dry and intact. No rash noted. Psychiatric: Mood and affect are anxious    ED Results / Procedures / Treatments   Labs (all labs ordered are listed, but only abnormal results are displayed) Labs Reviewed  CBC WITH DIFFERENTIAL/PLATELET - Abnormal; Notable for the following components:       Result Value   RBC 6.27 (*)    Hemoglobin 17.8 (*)    HCT 53.7 (*)    All other components within normal limits  COMPREHENSIVE METABOLIC PANEL - Abnormal; Notable for the following components:   Sodium 134 (*)    CO2 20 (*)    Glucose, Bld 140 (*)    Albumin 5.3 (*)    Total Bilirubin 3.2 (*)    All other components within normal limits  URINE DRUG SCREEN, QUALITATIVE ([facility] ONLY) - Abnormal; Notable for the following components:   Cannabinoid 50 Ng, Ur [place] POSITIVE (*)    Benzodiazepine, Ur Scrn POSITIVE (*)    All other components within normal limits  CBG MONITORING, ED - Abnormal; Notable for the following components:   Glucose-Capillary 131 (*)    All other components within normal limits     EKG  ED ECG REPORT I, Willy Eddy, the attending physician, personally viewed and interpreted this ECG.   Date: 11/01/2023  EKG Time: 10:00  Rate: 115  Rhythm: sinus  Axis: normal  Intervals: normal  ST&T Change: no stemi, no depressions  RADIOLOGY Please see ED Course for my review and interpretation.  I personally reviewed all radiographic images ordered to evaluate for the above acute complaints and reviewed radiology reports and findings.  These findings were personally discussed with the patient.  Please see medical record for radiology report.    PROCEDURES:  Critical Care performed: No  Procedures   MEDICATIONS ORDERED IN ED: Medications  sodium chloride 0.9 % bolus 1,000 mL (1,000 mLs Intravenous Bolus from Bag 11/01/23 1011)  midazolam (VERSED) injection 1 mg (1 mg Intravenous Given 11/01/23 1013)     IMPRESSION / MDM / ASSESSMENT AND PLAN / ED COURSE  I reviewed the triage vital signs and the nursing notes.                              Differential diagnosis includes, but is not limited to, Psychosis, delirium, medication effect, noncompliance, polysubstance abuse, Si, Hi, depression   Patient presenting to the ER for evaluation of  symptoms as described above.  Based on symptoms, risk factors and considered above differential, this presenting complaint could reflect a potentially life-threatening illness therefore the patient will be placed on continuous pulse oximetry and telemetry for monitoring.  Laboratory evaluation will be sent to evaluate for the above complaints.     Clinical Course as of 11/01/23 1504  Sun Nov 01, 2023  1156 Patient reassessed.  Does appear more calm but reporting dj vu and paranoia.  He denies any substance abuse or recreational drug use.  Will order CT head to evaluate for intracranial abnormality and but if this is negative do suspect this is likely secondary to underlying psychiatric disorder will consult psychiatry. [PR]  1313 CT head is normal.  Still awaiting urine UDS.  Patient medically cleared for psychiatric evaluation. [PR]    Clinical Course User Index [PR] Willy Eddy, MD     FINAL CLINICAL IMPRESSION(S) / ED DIAGNOSES   Final diagnoses:  Paranoia (HCC)     Rx / DC Orders   ED Discharge Orders     None        Note:  This document was prepared using Dragon voice recognition software and may include unintentional dictation errors.    Willy Eddy, MD 11/01/23 1504

## 2023-11-01 NOTE — BH Assessment (Signed)
Comprehensive Clinical Assessment (CCA) Screening, Triage and Referral Note  11/01/2023 NATHIAS ZUCKERMAN [id number]  Lonie Peak, 32 year old male who presents to [facility] ED voluntarily for treatment. Per triage note, Pt arrives via [facility] from home. Per report, pt has been acting strangely for the last several days. Pt admits he feels anxious and not like himself. Withdrawn during triage and reluctant to answer any questions. EMS reported HR 150's possible SVT, BP 160/120. Pt denies any illicit substance abuse, no mental health diagnosis, recent changes in medication. Denies pain currently. Denies SI/HI, no AVH   During TTS assessment pt presents alert and oriented x 4, restless but cooperative, and mood-congruent with affect. The pt appears to be responding to internal stimuli. Pt is presenting with delusional thinking. Pt verified the information provided to triage RN.   Pt identifies his main complaint to be that he had a panic attack. Patient presents to ED with stepfather at bedside. Patient gave consent for stepdad to remain in the room. Patient appears confused with extremely delayed responses. Patient states he had panic attack after hearing constant playing of a song which seemed to have triggered a traumatic "wave of emotions." Patient alerted his mom and EMS was called. Patient is poor historian as he has difficulty answering questions due to his current mental status. Patient reports previous dx for anxiety and depression. Patient reports he was taking Lexapro but stopped due to "insurance issues." Patient admits to recent use of marijuana and mushrooms. Patient reports self-injurious behaviors ("cutting his arm open") in 2017. Patient reports this was "not really a suicide attempt". Patient minimizes previous attempts stating they were not serious. Pt reports no prior INPT hx . Pt denies current SI/HI/AH/VH but says he was seeing familiar cars on the way to the hospital. Patient states he  does not want to stay at the hospital but is willing to stay overnight.    Per Marlinda Mike, NP, pt is recommended for overnight observation to be reassessed in the morning.    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 Family/Friend  What Is the Reason for Your Visit/Call Today? Panic attack and bizarre behaviors  How Long Has This Been Causing You Problems? <Week  What Do You Feel Would Help You the Most Today? Medication(s); Treatment for Depression or other mood problem   Have You Recently Had Any Thoughts About Hurting Yourself? No  Are You Planning to Commit Suicide/Harm Yourself At This time? No   Have you Recently Had Thoughts About Hurting Someone Karolee Ohs? No  Are You Planning to Harm Someone at This Time? No  Explanation: No data recorded  Have You Used Any Alcohol or Drugs in the Past 24 Hours? Yes  How Long Ago Did You Use Drugs or Alcohol? No data recorded What Did You Use and How Much? Marijuana   Do You Currently Have a Therapist/Psychiatrist? No  Name of Therapist/Psychiatrist: No data recorded  Have You Been Recently Discharged From Any Office Practice or Programs? No  Explanation of Discharge From Practice/Program: No data recorded   CCA Screening Triage Referral Assessment Type of Contact: Face-to-Face  Telemedicine Service Delivery:   Is this Initial or Reassessment?   Date Telepsych consult ordered in [facility]:    Time Telepsych consult ordered in [facility]:    Location of Assessment: [facility] ED  Provider Location:  [facility] ED    Collateral Involvement: Step dad   Does Patient Have a Court Appointed Legal Guardian? No data recorded Name and Contact of Legal Guardian: No data recorded If Minor and Not Living with Parent(s), Who has Custody? No data recorded Is CPS involved or ever been  involved? No data recorded Is APS involved or ever been involved? No data recorded  Patient Determined To Be At Risk for Harm To Self or Others Based on Review of Patient Reported Information or Presenting Complaint? No  Method: No Plan  Availability of Means: No access or NA  Intent: Vague intent or NA  Notification Required: No need or identified person  Additional Information for Danger to Others Potential: No data recorded Additional Comments for Danger to Others Potential: No data recorded Are There Guns or Other Weapons in Your Home? No data recorded Types of Guns/Weapons: No data recorded Are These Weapons Safely Secured?                            No data recorded Who Could Verify You Are Able To Have These Secured: No data recorded Do You Have any Outstanding Charges, Pending Court Dates, Parole/Probation? No data recorded Contacted To Inform of Risk of Harm To Self or Others: No data recorded  Does Patient Present under Involuntary Commitment? No    [place] of Residence: [place]   Patient Currently Receiving the Following Services: Not Receiving Services   Determination of Need: Emergent (2 hours)   Options For Referral: ED Visit; Medication Management   Discharge Disposition:     Clerance Lav, Counselor, LCAS-A

## 2023-11-01 NOTE — Consult Note (Signed)
Telepsych Consultation   Reason for Consult:  "Consult" Referring Physician:  Dr; Willy Eddy Location of Patient:    [facility] Location of Provider: Other: virtual home office  Patient Identification: Lance Fox MRN:  [id number] Principal Diagnosis: Substance-induced psychotic disorder with hallucinations (HCC) Diagnosis:  Principal Problem:   Substance-induced psychotic disorder with hallucinations (HCC) Active Problems:   Insomnia   Hx of psychoactive drug abuse (HCC)   Total Time spent with patient: 1 hour  Subjective:   Lance Fox is a 32 y.o. male patient admitted with . Per RN Triage Note dated 11/01/2023@1001am : "Pt arrives via [facility] from home. Per report, pt has been acting strangely for the last several days. Pt admits he feels anxious and not like himself. Withdrawn during triage and reluctant to answer any questions. EMS reported HR 150's possible SVT, BP 160/120. Pt denies any illicit substance abuse, no mental health diagnosis, recent changes in medication. Denies pain currently. Denies SI/HI, no AVH."   HPI:  Tele Assessment Lance Fox, 32 y.o., male patient  Patient seen via telepsych by this provider; chart reviewed and consulted with Dr. Marlou Porch on 11/01/23.  On evaluation Lance Fox is observed in exam room sitting on gurney, HOB elevated.  He is dressed in baseball cap, wearing glasses; and tie dyed hoodie. His step father is at the bedside, patient elects for him to remain during assessment.   Patient is alert and oriented to self and today's date;  he does appear confused, and dazed.  When asked questions, pt takes an inordinate amount of time to respond and sometimes does not respond.  He tries to cooperate but appears limited by his current mental status.  He denies suicidal or homicidal ideations; initially admits to seeing cars but then denies audible and visual hallucinations.   His speech is blocked and he looks uncomfortable,  however he denies physical pain.     He reports a hx for depression, anxiety.  Patient states he used to take lexapro for anxiety but stopped d/t insurance issues. He denies medication allergies.  He reports using marijuana via "flower" but stares off into space and does not answer when asked to elaborate.  Pt eventually able to clarify he smokes marijuana and uses mushrooms, prior to admission. While his father is providing collateral, patient states he called EMS because he had a panic attack while leaving his home.  Pt states, "a song brought back a wave of emotion, trauma, trauma endorsed." He reports a hx for self injurious behaviors, "cutting his arm" in 2017 required sutures but was not hospitalized.  He provides incongruent information whether that was a suicide attempt; Per chart review, his response today is consistent with his response 7 years ago.   He also reports other suicide attempts when he was younger, but he states they were not serious and minimizes his behavior.  Pt denies hx for physical or sexual abuse.    Labs: CMP shows low sodium at 134; glucose is elevated at 140mg /dl but non-fasting;  UDS is + for cannabinoids and benzodiazepines EKG is 302/414 Sinus tach Patient was medically cleared prior to psych assessment.   Per collateral received from is step father, Rolla Etienne, patient does not have hx for schizophrenia or bipolar or inpatient hospitalization.  Regarding events that led to current hospitalization, he states he was awakened this morning to the sound of EMS in his home talking with his son.  He states his wife told him she called  EMS for the patient.  He states his son appeared nervous and scared.  States he overheard patient mention something about a blanket and but was unable to make sense of it.   He states patient lives with his mother; states he has been with patient's mother since patient was 47 year old; He is unaware of any family mental health diagnosis. He   states patient was enrolled in AIT or honor classes while in high school and denies hx of neurocognitive impairment. He report patient's presentation today is not his baseline. He is unaware if patient takes prescribed medications and denies use of illicit drugs.  He does admit patient has hx of alcohol abuse but currently in remission.   During evaluation Lance Fox is observed sitting in bed, HOB elevated; He is alert/oriented to person and date only; His stated mood is, "anxious" dysphoric, and depressed; mood congruent with affect.  Patient's speech is blocked and he takes an inordinate amount of time to respond to questions. He makes fair eye contact and at times appears to stare into space. His thought process is irrelevant; He appears to be responding to internal stimulus.  He does not appear delusional or paranoid.  Patient denies suicidal/self-harm/homicidal ideation.  Patient has remained cooperative but was limited by his current mental status.  Past Psychiatric History:  Patient has hx for alcohol use disorder, cannabis use and ADHD; prior self injurious behaviors, cutting that required medical treatment 7 years ago.   Risk to Self:  yes, d/t declined mental status and blocked communication Risk to Others:  denies Prior Inpatient Therapy:  yes Prior Outpatient Therapy:    Past Medical History:  Past Medical History:  Diagnosis Date   ADHD (attention deficit hyperactivity disorder)    Anxiety     Past Surgical History:  Procedure Laterality Date   TONSILLECTOMY     Family History:  Family History  Problem Relation Age of Onset   ADD / ADHD Mother    Anxiety disorder Mother    Heart failure Other    Family Psychiatric  History: denies Social History:  Social History   Substance and Sexual Activity  Alcohol Use No     Social History   Substance and Sexual Activity  Drug Use Yes   Types: Marijuana   Comment: occasional    Social History   Socioeconomic  History   Marital status: Single    Spouse name: Not on file   Number of children: Not on file   Years of education: Not on file   Highest education level: Some college, no degree  Occupational History   Not on file  Tobacco Use   Smoking status: Former    Current packs/day: 0.00    Types: Cigarettes    Quit date: 2021    Years since quitting: 3.8   Smokeless tobacco: Never  Vaping Use   Vaping status: Every Day   Substances: Nicotine, Mixture of cannabinoids  Substance and Sexual Activity   Alcohol use: No   Drug use: Yes    Types: Marijuana    Comment: occasional   Sexual activity: Not Currently  Other Topics Concern   Not on file  Social History Narrative   Not on file   Social Determinants of Health   Financial Resource Strain: Not on file  Food Insecurity: Not on file  Transportation Needs: Not on file  Physical Activity: Not on file  Stress: Not on file (10/15/2023)  Social Connections: Not on file  Additional Social History:    Allergies:  No Known Allergies  Labs:  Results for orders placed or performed during the hospital encounter of 11/01/23 (from the past 48 hour(s))  CBC with Differential     Status: Abnormal   Collection Time: 11/01/23  9:58 AM  Result Value Ref Range   WBC 9.0 4.0 - 10.5 K/uL   RBC 6.27 (H) 4.22 - 5.81 MIL/uL   Hemoglobin 17.8 (H) 13.0 - 17.0 g/dL   HCT 09.8 (H) 11.9 - 14.7 %   MCV 85.6 80.0 - 100.0 fL   MCH 28.4 26.0 - 34.0 pg   MCHC 33.1 30.0 - 36.0 g/dL   RDW 82.9 56.2 - 13.0 %   Platelets 281 150 - 400 K/uL   nRBC 0.0 0.0 - 0.2 %   Neutrophils Relative % 66 %   Neutro Abs 6.0 1.7 - 7.7 K/uL   Lymphocytes Relative 27 %   Lymphs Abs 2.4 0.7 - 4.0 K/uL   Monocytes Relative 6 %   Monocytes Absolute 0.6 0.1 - 1.0 K/uL   Eosinophils Relative 0 %   Eosinophils Absolute 0.0 0.0 - 0.5 K/uL   Basophils Relative 1 %   Basophils Absolute 0.1 0.0 - 0.1 K/uL   Immature Granulocytes 0 %   Abs Immature Granulocytes 0.02 0.00 -  0.07 K/uL    Comment: Performed at [facility], [address] [place], [place] [address]  Comprehensive metabolic panel     Status: Abnormal   Collection Time: 11/01/23  9:58 AM  Result Value Ref Range   Sodium 134 (L) 135 - 145 mmol/L   Potassium 3.8 3.5 - 5.1 mmol/L   Chloride 100 98 - 111 mmol/L   CO2 20 (L) 22 - 32 mmol/L   Glucose, Bld 140 (H) 70 - 99 mg/dL    Comment: Glucose reference range applies only to samples taken after fasting for at least 8 hours.   BUN 15 6 - 20 mg/dL   Creatinine, Ser 4.69 0.61 - 1.24 mg/dL   Calcium 9.7 8.9 - 62.9 mg/dL   Total Protein 8.1 6.5 - 8.1 g/dL   Albumin 5.3 (H) 3.5 - 5.0 g/dL   AST 26 15 - 41 U/L   ALT 19 0 - 44 U/L   Alkaline Phosphatase 51 38 - 126 U/L   Total Bilirubin 3.2 (H) <1.2 mg/dL   GFR, Estimated >52 >84 mL/min    Comment: (NOTE) Calculated using the CKD-EPI Creatinine Equation (2021)    Anion gap 14 5 - 15    Comment: Performed at [facility], [address] [place], [place] [address]  CBG monitoring, ED     Status: Abnormal   Collection Time: 11/01/23 10:05 AM  Result Value Ref Range   Glucose-Capillary 131 (H) 70 - 99 mg/dL    Comment: Glucose reference range applies only to samples taken after fasting for at least 8 hours.  Urine Drug Screen, Qualitative ([facility] only)     Status: Abnormal   Collection Time: 11/01/23  2:20 PM  Result Value Ref Range   Tricyclic, Ur Screen NONE DETECTED NONE DETECTED   Amphetamines, Ur Screen NONE DETECTED NONE DETECTED   MDMA (Ecstasy)Ur Screen NONE DETECTED NONE DETECTED   Cocaine Metabolite,Ur [place] NONE DETECTED NONE DETECTED   Opiate, Ur Screen NONE DETECTED NONE DETECTED   Phencyclidine (PCP) Ur S NONE DETECTED NONE DETECTED   Cannabinoid 50 Ng, Ur [place] POSITIVE (A) NONE DETECTED   Barbiturates, Ur Screen NONE  DETECTED NONE DETECTED   Benzodiazepine, Ur Scrn POSITIVE (A) NONE DETECTED   Methadone Scn, Ur NONE DETECTED NONE DETECTED    Comment:  (NOTE) Tricyclics + metabolites, urine    Cutoff 1000 ng/mL Amphetamines + metabolites, urine  Cutoff 1000 ng/mL MDMA (Ecstasy), urine              Cutoff 500 ng/mL Cocaine Metabolite, urine          Cutoff 300 ng/mL Opiate + metabolites, urine        Cutoff 300 ng/mL Phencyclidine (PCP), urine         Cutoff 25 ng/mL Cannabinoid, urine                 Cutoff 50 ng/mL Barbiturates + metabolites, urine  Cutoff 200 ng/mL Benzodiazepine, urine              Cutoff 200 ng/mL Methadone, urine                   Cutoff 300 ng/mL  The urine drug screen provides only a preliminary, unconfirmed analytical test result and should not be used for non-medical purposes. Clinical consideration and professional judgment should be applied to any positive drug screen result due to possible interfering substances. A more specific alternate chemical method must be used in order to obtain a confirmed analytical result. Gas chromatography / mass spectrometry (GC/MS) is the preferred confirm atory method. Performed at [facility], [address] [place], [place] [address]     Medications:  Current Facility-Administered Medications  Medication Dose Route Frequency Provider Last Rate Last Admin   LORazepam (ATIVAN) tablet 0.5 mg  0.5 mg Oral TID PRN Chales Abrahams, NP       OLANZapine (ZYPREXA) tablet 10 mg  10 mg Oral QHS Chales Abrahams, NP       No current outpatient medications on file.    Musculoskeletal: pt moves all extremities but evaluated  Strength & Muscle Tone: within normal limits Gait & Station: deferred as assessment completed via video and did not witness patient ambulation  Patient leans:  deferred as assessment completed via video and did not witness patient ambulation   Psychiatric Specialty Exam:  Presentation  General Appearance: Bizarre; Fairly Groomed (patient apears dazed)  Eye Contact:Fair  Speech:Blocked; Slow  Speech  Volume:Decreased  Handedness:Right   Mood and Affect  Mood:Anxious; Dysphoric  Affect:Congruent; Constricted   Thought Process  Thought Processes:Irrevelant  Descriptions of Associations:Tangential  Orientation:Partial  Thought Content:Illogical; Tangential  History of Schizophrenia/Schizoaffective disorder:No data recorded Duration of Psychotic Symptoms:No data recorded Hallucinations:Hallucinations: Visual Description of Auditory Hallucinations: pt denies command hallucinations Description of Visual Hallucinations: patient endorsed seeing cars  Ideas of Reference:Delusions  Suicidal Thoughts:Suicidal Thoughts: No  Homicidal Thoughts:Homicidal Thoughts: No   Sensorium  Memory:Immediate Poor; Remote Poor; Recent Poor  Judgment:Poor  Insight:Lacking   Executive Functions  Concentration:Poor  Attention Span:Poor  Recall:Poor  Fund of Knowledge:Poor  Language:Good   Psychomotor Activity  Psychomotor Activity:Psychomotor Activity: Decreased   Assets  Assets:Financial Resources/Insurance; Housing; Social Support; Desire for Improvement   Sleep  Sleep:Sleep: Fair Number of Hours of Sleep: 4 ("I haven't been sleeping good")    Physical Exam: Physical Exam Cardiovascular:     Rate and Rhythm: Normal rate.     Pulses: Normal pulses.  Pulmonary:     Effort: Pulmonary effort is normal.  Musculoskeletal:        General: Normal range of motion.     Cervical back:  Normal range of motion.  Neurological:     Mental Status: He is alert. He is disoriented.  Psychiatric:        Attention and Perception: He is inattentive.        Mood and Affect: Mood is anxious. Affect is blunt.        Speech: Speech is delayed and tangential.        Behavior: Behavior is slowed and withdrawn.        Thought Content: Thought content is not paranoid. Thought content does not include homicidal or suicidal ideation. Thought content does not include homicidal or suicidal  plan.        Cognition and Memory: Cognition is impaired. Memory is impaired. He exhibits impaired recent memory and impaired remote memory.        Judgment: Judgment is impulsive.    Review of Systems  Constitutional: Negative.   HENT: Negative.    Eyes: Negative.   Respiratory: Negative.    Cardiovascular: Negative.   Gastrointestinal: Negative.   Genitourinary: Negative.   Musculoskeletal: Negative.   Skin: Negative.   Neurological: Negative.   Endo/Heme/Allergies: Negative.   Psychiatric/Behavioral:  Positive for hallucinations and substance abuse (endorse marjuana and mushroom use). The patient is nervous/anxious.    Blood pressure 118/73, pulse 84, temperature 97.6 F (36.4 C), temperature source Oral, resp. rate (!) 30, SpO2 99%. There is no height or weight on file to calculate BMI.  Treatment Plan Summary: Patient with hx for depression and self injurious behaviors; voluntarily presents via ambulance to emergence department for evaluation of "panic attack."  On assessment today, he is alert and oriented to self and date only.  His presentation is bizarre as his speech is blocked as if he's responding to internal stimulus.  However, he denies suicidal or homicidal ideations, when asked about AVH he admits to seeing cars but then denies AVH. His UDS is positive for cannabinoids and benzodiazepines, pt does admit to using marijuana and mushrooms but timeline of use is unclear.   Per collateral from patient's step father, there's no familial hx for mental illness and patient is not taking prescribed mental health medications; he was not aware of patent's marijuana or mushroom usage.  Patient's dysphoric presentation, mental declination and poor historian, indicates his judgment and decision making capacity is impaired. Based on above, recommended overnight admission to start medication to help clear his symptoms, and allow for safety monitoring.  He will be re-evaluated by psychiatry in  the AM.  Patient and his step father agreeable to above.  All questions and concerns were answered to their satisfaction.   Dx: depression with psychotic features; substance induced psychotic disorder with hallucinations.  EKG within normal limits, no prolonged QT intervals  Will start the following medications: Olanzapine 10mg  po daily for mood Ativan 0.5mg  po TID prn anxiety  Daily contact with patient to assess and evaluate symptoms and progress in treatment and Medication management  Disposition:  Patient is recommended for overnight observation and AM reassessment.   This service was provided via telemedicine using a 2-way, interactive audio and video technology.  Dr. America Brown, ED Provider; Lance Fox, TTS counselor,  were all informed of above recommendation and disposition via secure chat.   Names of all persons participating in this telemedicine service and their role in this encounter. Name: Lance Fox Role: Patient  Name: Lance Fox Role: Patient's father  Name: Ophelia Shoulder Role: PMHNP  Name: Lance Fox Role: [facility] Counselor  Name: Lance Fox  [facility] Role Psychiatrist    Chales Abrahams, NP 11/01/2023 5:28 PM

## 2023-11-01 NOTE — ED Notes (Signed)
Mom called to check on patient. Updated on current status.

## 2023-11-01 NOTE — ED Triage Notes (Signed)
Pt arrives via [facility] from home. Per report, pt has been acting strangely for the last several days. Pt admits he feels anxious and not like himself. Withdrawn during triage and reluctant to answer any questions. EMS reported HR 150's possible SVT, BP 160/120. Pt denies any illicit substance abuse, no mental health diagnosis, recent changes in medication. Denies pain currently. Denies SI/HI, no AVH

## 2023-11-01 NOTE — ED Notes (Signed)
Waiting on TTS to message back and TTS cart will be brought into room

## 2023-11-02 ENCOUNTER — Inpatient Hospital Stay
Admission: AD | Admit: 2023-11-02 | Discharge: 2023-11-08 | DRG: 885 | Disposition: A | Discharge status: Home / Self Care | Payer: BLUE CROSS/BLUE SHIELD | Source: Intra-hospital | Attending: Psychiatry | Admitting: Psychiatry

## 2023-11-02 ENCOUNTER — Encounter: Payer: Self-pay | Admitting: Psychiatry

## 2023-11-02 ENCOUNTER — Other Ambulatory Visit: Payer: Self-pay

## 2023-11-02 DIAGNOSIS — Z9152 Personal history of nonsuicidal self-harm: Secondary | ICD-10-CM

## 2023-11-02 DIAGNOSIS — F4310 Post-traumatic stress disorder, unspecified: Secondary | ICD-10-CM | POA: Diagnosis present

## 2023-11-02 DIAGNOSIS — F410 Panic disorder [episodic paroxysmal anxiety] without agoraphobia: Secondary | ICD-10-CM | POA: Diagnosis present

## 2023-11-02 DIAGNOSIS — Z79899 Other long term (current) drug therapy: Secondary | ICD-10-CM | POA: Diagnosis not present

## 2023-11-02 DIAGNOSIS — F17290 Nicotine dependence, other tobacco product, uncomplicated: Secondary | ICD-10-CM | POA: Diagnosis present

## 2023-11-02 DIAGNOSIS — Z818 Family history of other mental and behavioral disorders: Secondary | ICD-10-CM | POA: Diagnosis not present

## 2023-11-02 DIAGNOSIS — Z9151 Personal history of suicidal behavior: Secondary | ICD-10-CM

## 2023-11-02 DIAGNOSIS — Z8249 Family history of ischemic heart disease and other diseases of the circulatory system: Secondary | ICD-10-CM | POA: Diagnosis not present

## 2023-11-02 DIAGNOSIS — R739 Hyperglycemia, unspecified: Secondary | ICD-10-CM | POA: Diagnosis present

## 2023-11-02 DIAGNOSIS — F19959 Other psychoactive substance use, unspecified with psychoactive substance-induced psychotic disorder, unspecified: Secondary | ICD-10-CM | POA: Diagnosis not present

## 2023-11-02 DIAGNOSIS — F19951 Other psychoactive substance use, unspecified with psychoactive substance-induced psychotic disorder with hallucinations: Secondary | ICD-10-CM | POA: Diagnosis not present

## 2023-11-02 DIAGNOSIS — F1210 Cannabis abuse, uncomplicated: Secondary | ICD-10-CM | POA: Insufficient documentation

## 2023-11-02 DIAGNOSIS — G4700 Insomnia, unspecified: Secondary | ICD-10-CM | POA: Diagnosis present

## 2023-11-02 DIAGNOSIS — F333 Major depressive disorder, recurrent, severe with psychotic symptoms: Secondary | ICD-10-CM | POA: Diagnosis present

## 2023-11-02 DIAGNOSIS — F339 Major depressive disorder, recurrent, unspecified: Principal | ICD-10-CM

## 2023-11-02 MED ORDER — OLANZAPINE 10 MG PO TABS
10.0000 mg | ORAL_TABLET | Freq: Every day | ORAL | Status: DC
Start: 2023-11-02 — End: 2023-11-08
  Administered 2023-11-02 – 2023-11-07 (×6): 10 mg via ORAL
  Filled 2023-11-02 (×6): qty 1

## 2023-11-02 MED ORDER — OLANZAPINE 5 MG PO TBDP: 5.0000 mg | ORAL_TABLET | Freq: Three times a day (TID) | ORAL | Status: DC | PRN

## 2023-11-02 MED ORDER — ALUM & MAG HYDROXIDE-SIMETH 200-200-20 MG/5ML PO SUSP: 30.0000 mL | ORAL | Status: DC | PRN

## 2023-11-02 MED ORDER — ENSURE ENLIVE PO LIQD: 237.0000 mL | Freq: Two times a day (BID) | ORAL | Status: DC

## 2023-11-02 MED ORDER — LAMOTRIGINE 100 MG PO TABS
100.0000 mg | ORAL_TABLET | Freq: Two times a day (BID) | ORAL | Status: DC
  Administered 2023-11-02 – 2023-11-08 (×12): 100 mg via ORAL
  Filled 2023-11-02 (×13): qty 1

## 2023-11-02 MED ORDER — HYDROXYZINE HCL 25 MG PO TABS
25.0000 mg | ORAL_TABLET | Freq: Three times a day (TID) | ORAL | Status: DC | PRN
  Administered 2023-11-05: 25 mg via ORAL
  Filled 2023-11-02: qty 1

## 2023-11-02 MED ORDER — MAGNESIUM HYDROXIDE 400 MG/5ML PO SUSP: 30.0000 mL | Freq: Every day | ORAL | Status: DC | PRN

## 2023-11-02 MED ORDER — ESCITALOPRAM OXALATE 10 MG PO TABS: 10.0000 mg | ORAL_TABLET | Freq: Every day | ORAL | Status: DC

## 2023-11-02 MED ORDER — BUPROPION HCL 100 MG PO TABS
100.0000 mg | ORAL_TABLET | Freq: Once | ORAL | Status: DC
  Filled 2023-11-02: qty 1

## 2023-11-02 MED ORDER — TRAZODONE HCL 100 MG PO TABS
100.0000 mg | ORAL_TABLET | Freq: Every day | ORAL | Status: DC
  Administered 2023-11-02 – 2023-11-07 (×6): 100 mg via ORAL
  Filled 2023-11-02 (×6): qty 1

## 2023-11-02 MED ORDER — ENSURE ENLIVE PO LIQD
237.0000 mL | Freq: Two times a day (BID) | ORAL | Status: DC
  Administered 2023-11-03 – 2023-11-07 (×4): 237 mL via ORAL

## 2023-11-02 MED ORDER — ESCITALOPRAM OXALATE 10 MG PO TABS
20.0000 mg | ORAL_TABLET | Freq: Every day | ORAL | Status: DC
  Administered 2023-11-03 – 2023-11-08 (×6): 20 mg via ORAL
  Filled 2023-11-02 (×6): qty 2

## 2023-11-02 MED ORDER — TRAZODONE HCL 100 MG PO TABS: 100.0000 mg | ORAL_TABLET | Freq: Every evening | ORAL | Status: DC | PRN

## 2023-11-02 MED ORDER — TRAZODONE HCL 50 MG PO TABS: 50.0000 mg | ORAL_TABLET | Freq: Every evening | ORAL | Status: DC | PRN

## 2023-11-02 MED ORDER — ACETAMINOPHEN 325 MG PO TABS: 650.0000 mg | ORAL_TABLET | Freq: Four times a day (QID) | ORAL | Status: DC | PRN

## 2023-11-02 NOTE — Plan of Care (Signed)
  Problem: Education: Goal: Emotional status will improve Outcome: Not Progressing   Problem: Coping: Goal: Ability to demonstrate self-control will improve Outcome: Progressing   Problem: Safety: Goal: Periods of time without injury will increase Outcome: Progressing

## 2023-11-02 NOTE — Progress Notes (Signed)
Patient isolative to self and room. Flat affect, family member requesting he be discharged. Patient denies SI, Hi, AVH endorses depression. Slightly paranoid. Forwards minimal, watchful. Encouragement and support provided. Safety checks maintained. Medications given as prescribed. Pt receptive and remains safe on unit with q 15 min checks.

## 2023-11-02 NOTE — H&P (Cosign Needed Addendum)
Psychiatric Admission Assessment Adult  Patient Identification: Lance Fox MRN:  [id number] Date of Evaluation:  11/02/2023 Chief Complaint:  Substance-induced psychotic disorder [facility]) [F19.959] Principal Diagnosis: Substance-induced psychotic disorder (HCC) Diagnosis:  Principal Problem:   Substance-induced psychotic disorder (HCC)  History of Present Illness:  32 year old  African American male with a history of depression, anxiety, and substance use (marijuana and mushrooms) who presents after experiencing multiple panic attacks. The most recent episode occurred while leaving his home, reportedly triggered by hearing a song on the radio that brought back traumatic memories. The patient reports that his mother called the police during the incident, and he voluntarily contacted EMS for assistance. He describes his mood as persistently depressed with lack of motivation and intermittent feelings of paranoia, though he struggles to articulate the nature of these feelings. He denies current suicidal ideation, homicidal ideation, and auditory/visual hallucinations. However, he reports a history of self-injurious behavior, including cutting his arm in 2017, which required sutures but did not result in hospitalization. He minimizes the severity of past suicide attempts, stating they were "not serious." The patient reports he previously took Lexapro for anxiety but discontinued it due to insurance issues. He states he has been experiencing worsening depression over the past several months and describes his mood as "a little better today" compared to prior days. Before admission, he used marijuana ("flower") and mushrooms but denies disclosing additional details about his substance use.During the evaluation, the patient presented with a flat affect, thought blocking, and minimal eye contact, and he appeared confused and dazed. He states he feels ready for discharge but was informed of the need for a  comprehensive assessment and safe discharge plan.  Associated Signs/Symptoms: Depression Symptoms:  depressed mood, insomnia, hopelessness, impaired memory, anxiety, panic attacks, loss of energy/fatigue, disturbed sleep, weight loss, decreased appetite, (Hypo) Manic Symptoms:  Impulsivity, Anxiety Symptoms:  Excessive Worry, Panic Symptoms, Social Anxiety, Psychotic Symptoms:  Paranoia, PTSD Symptoms: Had a traumatic exposure:  triggered by music  "A song brought back a wave of emotion, trauma, trauma endorsed.""I feel paranoid, but I can't describe it." Total Time spent with patient: 2 hours  Past Psychiatric History: Anxiety  Is the patient at risk to self? No.  Has the patient been a risk to self in the past 6 months? No.  Has the patient been a risk to self within the distant past? No.  Is the patient a risk to others? No.  Has the patient been a risk to others in the past 6 months? No.  Has the patient been a risk to others within the distant past? No.   [place] Scale:  Flowsheet Row Admission (Current) from 11/02/2023 in [facility] INPATIENT BEHAV[facility]AL MEDICINE ED from 11/01/2023 in [facility] Emergency Department at [facility] Visit from 02/09/2023 in confidential department  C-SSRS RISK CATEGORY No Risk No Risk Low Risk         Prior Inpatient Therapy: No. If yes, describe none   Prior Outpatient Therapy: Yes.   If yes, describe IOP   Alcohol Screening: 1. How often do you have a drink containing alcohol?: Never 2. How many drinks containing alcohol do you have on a typical day when you are drinking?: 1 or 2 3. How often do you have six or more drinks on one occasion?: Never AUDIT-C Score: 0 4. How often during the last year have you found that you were not able to stop drinking once you had started?: Never 5. How often during the last  year have you failed to do what was normally expected from you because of drinking?: Never 6. How often during the last  year have you needed a first drink in the morning to get yourself going after a heavy drinking session?: Never 7. How often during the last year have you had a feeling of guilt of remorse after drinking?: Never 8. How often during the last year have you been unable to remember what happened the night before because you had been drinking?: Never 9. Have you or someone else been injured as a result of your drinking?: No 10. Has a relative or friend or a doctor or another health worker been concerned about your drinking or suggested you cut down?: No Alcohol Use Disorder Identification Test Final Score (AUDIT): 0 Alcohol Brief Interventions/Follow-up: Patient Refused Substance Abuse History in the last 12 months:  Yes.   Consequences of Substance Abuse: Withdrawal Symptoms:   Diaphoresis Headaches Tremors Previous Psychotropic Medications: Yes  Psychological Evaluations: No  Past Medical History:  Past Medical History:  Diagnosis Date   ADHD (attention deficit hyperactivity disorder)    Anxiety     Past Surgical History:  Procedure Laterality Date   TONSILLECTOMY     Family History:  Family History  Problem Relation Age of Onset   ADD / ADHD Mother    Anxiety disorder Mother    Heart failure Other    Family Psychiatric  History: see above  Tobacco Screening:  Social History   Tobacco Use  Smoking Status Former   Current packs/day: 0.00   Types: Cigarettes   Quit date: 2021   Years since quitting: 3.9  Smokeless Tobacco Never    BH Tobacco Counseling     Are you interested in Tobacco Cessation Medications?  No value filed. Counseled patient on smoking cessation:  No value filed. Reason Tobacco Screening Not Completed: Patient Refused Screening       Social History:  Social History   Substance and Sexual Activity  Alcohol Use No     Social History   Substance and Sexual Activity  Drug Use Yes   Types: Marijuana   Comment: occasional    Additional Social  History:                           Allergies:  No Known Allergies Lab Results:  Results for orders placed or performed during the hospital encounter of 11/01/23 (from the past 48 hour(s))  CBC with Differential     Status: Abnormal   Collection Time: 11/01/23  9:58 AM  Result Value Ref Range   WBC 9.0 4.0 - 10.5 K/uL   RBC 6.27 (H) 4.22 - 5.81 MIL/uL   Hemoglobin 17.8 (H) 13.0 - 17.0 g/dL   HCT 16.1 (H) 09.6 - 04.5 %   MCV 85.6 80.0 - 100.0 fL   MCH 28.4 26.0 - 34.0 pg   MCHC 33.1 30.0 - 36.0 g/dL   RDW 40.9 81.1 - 91.4 %   Platelets 281 150 - 400 K/uL   nRBC 0.0 0.0 - 0.2 %   Neutrophils Relative % 66 %   Neutro Abs 6.0 1.7 - 7.7 K/uL   Lymphocytes Relative 27 %   Lymphs Abs 2.4 0.7 - 4.0 K/uL   Monocytes Relative 6 %   Monocytes Absolute 0.6 0.1 - 1.0 K/uL   Eosinophils Relative 0 %   Eosinophils Absolute 0.0 0.0 - 0.5 K/uL   Basophils Relative 1 %  Basophils Absolute 0.1 0.0 - 0.1 K/uL   Immature Granulocytes 0 %   Abs Immature Granulocytes 0.02 0.00 - 0.07 K/uL    Comment: Performed at [facility], [address] [place], [place] [address]  Comprehensive metabolic panel     Status: Abnormal   Collection Time: 11/01/23  9:58 AM  Result Value Ref Range   Sodium 134 (L) 135 - 145 mmol/L   Potassium 3.8 3.5 - 5.1 mmol/L   Chloride 100 98 - 111 mmol/L   CO2 20 (L) 22 - 32 mmol/L   Glucose, Bld 140 (H) 70 - 99 mg/dL    Comment: Glucose reference range applies only to samples taken after fasting for at least 8 hours.   BUN 15 6 - 20 mg/dL   Creatinine, Ser 4.62 0.61 - 1.24 mg/dL   Calcium 9.7 8.9 - 70.3 mg/dL   Total Protein 8.1 6.5 - 8.1 g/dL   Albumin 5.3 (H) 3.5 - 5.0 g/dL   AST 26 15 - 41 U/L   ALT 19 0 - 44 U/L   Alkaline Phosphatase 51 38 - 126 U/L   Total Bilirubin 3.2 (H) <1.2 mg/dL   GFR, Estimated >50 >09 mL/min    Comment: (NOTE) Calculated using the CKD-EPI Creatinine Equation (2021)    Anion gap 14 5 - 15    Comment: Performed  at [facility], [address] [place], [place] [address]  CBG monitoring, ED     Status: Abnormal   Collection Time: 11/01/23 10:05 AM  Result Value Ref Range   Glucose-Capillary 131 (H) 70 - 99 mg/dL    Comment: Glucose reference range applies only to samples taken after fasting for at least 8 hours.  Urine Drug Screen, Qualitative ([facility] only)     Status: Abnormal   Collection Time: 11/01/23  2:20 PM  Result Value Ref Range   Tricyclic, Ur Screen NONE DETECTED NONE DETECTED   Amphetamines, Ur Screen NONE DETECTED NONE DETECTED   MDMA (Ecstasy)Ur Screen NONE DETECTED NONE DETECTED   Cocaine Metabolite,Ur [place] NONE DETECTED NONE DETECTED   Opiate, Ur Screen NONE DETECTED NONE DETECTED   Phencyclidine (PCP) Ur S NONE DETECTED NONE DETECTED   Cannabinoid 50 Ng, Ur [place] POSITIVE (A) NONE DETECTED   Barbiturates, Ur Screen NONE DETECTED NONE DETECTED   Benzodiazepine, Ur Scrn POSITIVE (A) NONE DETECTED   Methadone Scn, Ur NONE DETECTED NONE DETECTED    Comment: (NOTE) Tricyclics + metabolites, urine    Cutoff 1000 ng/mL Amphetamines + metabolites, urine  Cutoff 1000 ng/mL MDMA (Ecstasy), urine              Cutoff 500 ng/mL Cocaine Metabolite, urine          Cutoff 300 ng/mL Opiate + metabolites, urine        Cutoff 300 ng/mL Phencyclidine (PCP), urine         Cutoff 25 ng/mL Cannabinoid, urine                 Cutoff 50 ng/mL Barbiturates + metabolites, urine  Cutoff 200 ng/mL Benzodiazepine, urine              Cutoff 200 ng/mL Methadone, urine                   Cutoff 300 ng/mL  The urine drug screen provides only a preliminary, unconfirmed analytical test result and should not be used for non-medical purposes. Clinical consideration and professional judgment should be applied to  any positive drug screen result due to possible interfering substances. A more specific alternate chemical method must be used in order to obtain a confirmed analytical result. Gas chromatography /  mass spectrometry (GC/MS) is the preferred confirm atory method. Performed at [facility], [address] [place], [place] [address]     Blood Alcohol level:  Lab Results  Component Value Date   ETH 169 (H) 03/29/2016    Metabolic Disorder Labs:  No results found for: "HGBA1C", "MPG" No results found for: "PROLACTIN" Lab Results  Component Value Date   CHOL 151 11/25/2022   TRIG 45 11/25/2022   HDL 59 11/25/2022   CHOLHDL 2.6 11/25/2022   LDLCALC 82 11/25/2022    Current Medications: Current Facility-Administered Medications  Medication Dose Route Frequency Provider Last Rate Last Admin   acetaminophen (TYLENOL) tablet 650 mg  650 mg Oral Q6H PRN Lauree Chandler, NP       alum & mag hydroxide-simeth (MAALOX/MYLANTA) 200-200-20 MG/5ML suspension 30 mL  30 mL Oral Q4H PRN Lauree Chandler, NP       Melene Muller ON 11/03/2023] escitalopram (LEXAPRO) tablet 10 mg  10 mg Oral Daily Myriam Forehand, NP       feeding supplement (ENSURE ENLIVE / ENSURE PLUS) liquid 237 mL  237 mL Oral BID BM Sarina Ill, DO       feeding supplement (ENSURE ENLIVE / ENSURE PLUS) liquid 237 mL  237 mL Oral BID BM Sarina Ill, DO       hydrOXYzine (ATARAX) tablet 25 mg  25 mg Oral TID PRN Myriam Forehand, NP       lamoTRIgine (LAMICTAL) tablet 100 mg  100 mg Oral BID Myriam Forehand, NP       magnesium hydroxide (MILK OF MAGNESIA) suspension 30 mL  30 mL Oral Daily PRN Lauree Chandler, NP       OLANZapine (ZYPREXA) tablet 10 mg  10 mg Oral QHS Lauree Chandler, NP       OLANZapine zydis (ZYPREXA) disintegrating tablet 5 mg  5 mg Oral TID PRN Lauree Chandler, NP       traZODone (DESYREL) tablet 100 mg  100 mg Oral QHS PRN Myriam Forehand, NP       PTA Medications: No medications prior to admission.    Musculoskeletal: Strength & Muscle Tone: within normal limits Gait & Station: normal Patient leans: N/A            Psychiatric Specialty  Exam:  Presentation  General Appearance:  Disheveled  Eye Contact: Poor  Speech: Slow; Clear and Coherent  Speech Volume: Decreased  Handedness: Right   Mood and Affect  Mood: Depressed; Hopeless  Affect: Depressed; Restricted   Thought Process  Thought Processes: Coherent  Duration of Psychotic Symptoms: onset 2 months Past Diagnosis of Schizophrenia or Psychoactive disorder: No  Descriptions of Associations:Intact  Orientation:Full (Time, Place and Person) (and situation)  Thought Content:WDL  Hallucinations:Hallucinations: None Description of Auditory Hallucinations: denies Description of Visual Hallucinations: denies  Ideas of Reference:None  Suicidal Thoughts:Suicidal Thoughts: No  Homicidal Thoughts:Homicidal Thoughts: No   Sensorium  Memory: Immediate Fair; Remote Fair  Judgment: Poor  Insight: Poor   Executive Functions  Concentration: Poor  Attention Span: Fair  Recall: Good  Fund of Knowledge: Good  Language: Fair   Psychomotor Activity  Psychomotor Activity: Psychomotor Activity: Normal   Assets  Assets: Social Support; Manufacturing systems engineer; Financial Resources/Insurance   Sleep  Sleep: Sleep: Fair  Number of Hours of Sleep: 6    Physical Exam: Physical Exam Vitals and nursing note reviewed.  Constitutional:      Appearance: Normal appearance.  HENT:     Head: Normocephalic and atraumatic.     Nose: Nose normal.  Pulmonary:     Effort: Pulmonary effort is normal.  Musculoskeletal:        General: Normal range of motion.     Cervical back: Normal range of motion.  Neurological:     General: No focal deficit present.     Mental Status: He is alert and oriented to person, place, and time.  Psychiatric:        Attention and Perception: He is inattentive.        Mood and Affect: Mood is anxious and depressed. Affect is flat.        Speech: Speech is delayed.        Behavior: Behavior is slowed and  withdrawn. Behavior is cooperative.        Thought Content: Thought content normal.        Cognition and Memory: Cognition and memory normal.        Judgment: Judgment normal.    Review of Systems  All other systems reviewed and are negative.  Blood pressure 123/70, pulse 86, temperature 98.1 F (36.7 C), temperature source Oral, resp. rate 16, height 6\' 1"  (1.854 m), weight 68.9 kg, SpO2 98%. Body mass index is 20.05 kg/m.  Treatment Plan Summary: Daily contact with patient to assess and evaluate symptoms and progress in treatment and Medication management Lamictal 100 mg BID (twice daily) added to the treatment plan to address mood stabilization and symptoms of depression associated with Major Depressive Disorder or mood instability. Initiate Lexapro (escitalopram) 20 mg daily to address anxiety and depressive symptoms. Consider starting Hydroxyzine 25 mg PRN for acute anxiety. Evaluate need for antipsychotic medication if paranoia persists Continue exploring past self-injurious behaviors and suicidal ideation to assess risk. Psychoeducation on the risks of marijuana and mushroom use, particularly in relation to mental health and panic attacks. Observation Level/Precautions:  Continuous Observation Fall 15 minute checks Seizure  Laboratory:   none at this time  Psychotherapy:    Medications:  Lamictal Lexapro   Consultations:    Discharge Concerns:    Estimated LOS:  Other:     Physician Treatment Plan for Primary Diagnosis: Substance-induced psychotic disorder (HCC) Long Term Goal(s): Improvement in symptoms so as ready for discharge  Short Term Goals: Ability to identify changes in lifestyle to reduce recurrence of condition will improve, Ability to verbalize feelings will improve, Ability to disclose and discuss suicidal ideas, Ability to demonstrate self-control will improve, Ability to identify and develop effective coping behaviors will improve, Ability to maintain  clinical measurements within normal limits will improve, Compliance with prescribed medications will improve, and Ability to identify triggers associated with substance abuse/mental health issues will improve  Physician Treatment Plan for Secondary Diagnosis: Principal Problem:   Substance-induced psychotic disorder (HCC)  Long Term Goal(s): Improvement in symptoms so as ready for discharge  Short Term Goals: Ability to identify changes in lifestyle to reduce recurrence of condition will improve, Ability to verbalize feelings will improve, Ability to disclose and discuss suicidal ideas, Ability to demonstrate self-control will improve, Ability to identify and develop effective coping behaviors will improve, Ability to maintain clinical measurements within normal limits will improve, Compliance with prescribed medications will improve, and Ability to identify triggers associated with substance abuse/mental health issues will  improve  I certify that inpatient services furnished can reasonably be expected to improve the patient's condition.    Myriam Forehand, NP 11/25/20248:05 PM

## 2023-11-02 NOTE — BH Assessment (Signed)
Patient is to be admitted to [facility] BMU today 11/02/23 by Dr.  Marlou Porch .  Attending Physician will be Dr. Marlou Porch.   Patient has been assigned to room 309, by [facility] Charge Nurse Johnny Bridge.    ER staff is aware of the admission: Misty Stanley, ER Secretary   Dr. Larinda Buttery, ER MD  Pattricia Boss, Patient's Nurse  Juliette Alcide, Patient Access.
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
## 2023-11-02 NOTE — Consult Note (Signed)
[facility] Face-to-Face Psychiatry Consult   Reason for Consult:  consult Referring Physician:  Dr. Christy Sartorius Patient Identification: Lance Fox MRN:  [id number] Principal Diagnosis: Substance-induced psychotic disorder with hallucinations (HCC) Diagnosis:  Principal Problem:   Substance-induced psychotic disorder with hallucinations (HCC) Active Problems:   Insomnia   Hx of psychoactive drug abuse (HCC)  Total Time spent with patient:  25 minutes  Subjective:  "panic attacks, multiple"  HPI:   Pt chart reviewed and assessed face to face. When asked reason for emergency department admission, reports "panic attacks, multiple". Reports he has had panic attacks in the past but "never this often". Reports yesterday a song came on the radio, when the radio began to change stations, and he "remembered something, and it brought on bad feelings". Reports his mother called the police. He reports he feels paranoid although when asked about this reports he cannot describe it. Yesterday, had reported to psych NP use of marijuana and mushrooms prior to admission. Today he reports feeling "a little better". Pt presents bizarre, slowed, with thought blocking, minimal eye contact. Discussed inpatient admission with pt who was in agreement. He denies suicidal, homicidal ideations. He denies auditory visual hallucinations.  Past Psychiatric History: ADHD,   Risk to Self: Denies suicidal ideations Risk to Others: Denies homicidal ideations Prior Inpatient Therapy: Denies inpatient psychiatric admission, reports has been to substance use treatment in the past Prior Outpatient Therapy: Yes  Past Medical History:  Past Medical History:  Diagnosis Date   ADHD (attention deficit hyperactivity disorder)    Anxiety     Past Surgical History:  Procedure Laterality Date   TONSILLECTOMY     Family History:  Family History  Problem Relation Age of Onset   ADD / ADHD Mother    Anxiety disorder  Mother    Heart failure Other    Family Psychiatric  History: see above Social History:  Social History   Substance and Sexual Activity  Alcohol Use No     Social History   Substance and Sexual Activity  Drug Use Yes   Types: Marijuana   Comment: occasional    Social History   Socioeconomic History   Marital status: Single    Spouse name: Not on file   Number of children: Not on file   Years of education: Not on file   Highest education level: Some college, no degree  Occupational History   Not on file  Tobacco Use   Smoking status: Former    Current packs/day: 0.00    Types: Cigarettes    Quit date: 2021    Years since quitting: 3.9   Smokeless tobacco: Never  Vaping Use   Vaping status: Every Day   Substances: Nicotine, Mixture of cannabinoids  Substance and Sexual Activity   Alcohol use: No   Drug use: Yes    Types: Marijuana    Comment: occasional   Sexual activity: Not Currently  Other Topics Concern   Not on file  Social History Narrative   Not on file   Social Determinants of Health   Financial Resource Strain: Not on file  Food Insecurity: Not on file  Transportation Needs: Not on file  Physical Activity: Not on file  Stress: Not on file (10/15/2023)  Social Connections: Not on file   Additional Social History:    Allergies:  No Known Allergies  Labs:  Results for orders placed or performed during the hospital encounter of 11/01/23 (from the past 48 hour(s))  CBC with  Differential     Status: Abnormal   Collection Time: 11/01/23  9:58 AM  Result Value Ref Range   WBC 9.0 4.0 - 10.5 K/uL   RBC 6.27 (H) 4.22 - 5.81 MIL/uL   Hemoglobin 17.8 (H) 13.0 - 17.0 g/dL   HCT 40.9 (H) 81.1 - 91.4 %   MCV 85.6 80.0 - 100.0 fL   MCH 28.4 26.0 - 34.0 pg   MCHC 33.1 30.0 - 36.0 g/dL   RDW 78.2 95.6 - 21.3 %   Platelets 281 150 - 400 K/uL   nRBC 0.0 0.0 - 0.2 %   Neutrophils Relative % 66 %   Neutro Abs 6.0 1.7 - 7.7 K/uL   Lymphocytes Relative 27  %   Lymphs Abs 2.4 0.7 - 4.0 K/uL   Monocytes Relative 6 %   Monocytes Absolute 0.6 0.1 - 1.0 K/uL   Eosinophils Relative 0 %   Eosinophils Absolute 0.0 0.0 - 0.5 K/uL   Basophils Relative 1 %   Basophils Absolute 0.1 0.0 - 0.1 K/uL   Immature Granulocytes 0 %   Abs Immature Granulocytes 0.02 0.00 - 0.07 K/uL    Comment: Performed at [facility], [address] [place], [place] [address]  Comprehensive metabolic panel     Status: Abnormal   Collection Time: 11/01/23  9:58 AM  Result Value Ref Range   Sodium 134 (L) 135 - 145 mmol/L   Potassium 3.8 3.5 - 5.1 mmol/L   Chloride 100 98 - 111 mmol/L   CO2 20 (L) 22 - 32 mmol/L   Glucose, Bld 140 (H) 70 - 99 mg/dL    Comment: Glucose reference range applies only to samples taken after fasting for at least 8 hours.   BUN 15 6 - 20 mg/dL   Creatinine, Ser 8.46 0.61 - 1.24 mg/dL   Calcium 9.7 8.9 - 96.2 mg/dL   Total Protein 8.1 6.5 - 8.1 g/dL   Albumin 5.3 (H) 3.5 - 5.0 g/dL   AST 26 15 - 41 U/L   ALT 19 0 - 44 U/L   Alkaline Phosphatase 51 38 - 126 U/L   Total Bilirubin 3.2 (H) <1.2 mg/dL   GFR, Estimated >95 >28 mL/min    Comment: (NOTE) Calculated using the CKD-EPI Creatinine Equation (2021)    Anion gap 14 5 - 15    Comment: Performed at [facility], [address] [place], [place] [address]  CBG monitoring, ED     Status: Abnormal   Collection Time: 11/01/23 10:05 AM  Result Value Ref Range   Glucose-Capillary 131 (H) 70 - 99 mg/dL    Comment: Glucose reference range applies only to samples taken after fasting for at least 8 hours.  Urine Drug Screen, Qualitative ([facility] only)     Status: Abnormal   Collection Time: 11/01/23  2:20 PM  Result Value Ref Range   Tricyclic, Ur Screen NONE DETECTED NONE DETECTED   Amphetamines, Ur Screen NONE DETECTED NONE DETECTED   MDMA (Ecstasy)Ur Screen NONE DETECTED NONE DETECTED   Cocaine Metabolite,Ur [place] NONE DETECTED NONE DETECTED   Opiate, Ur Screen NONE DETECTED  NONE DETECTED   Phencyclidine (PCP) Ur S NONE DETECTED NONE DETECTED   Cannabinoid 50 Ng, Ur [place] POSITIVE (A) NONE DETECTED   Barbiturates, Ur Screen NONE DETECTED NONE DETECTED   Benzodiazepine, Ur Scrn POSITIVE (A) NONE DETECTED   Methadone Scn, Ur NONE DETECTED NONE DETECTED    Comment: (NOTE) Tricyclics + metabolites, urine  Cutoff 1000 ng/mL Amphetamines + metabolites, urine  Cutoff 1000 ng/mL MDMA (Ecstasy), urine              Cutoff 500 ng/mL Cocaine Metabolite, urine          Cutoff 300 ng/mL Opiate + metabolites, urine        Cutoff 300 ng/mL Phencyclidine (PCP), urine         Cutoff 25 ng/mL Cannabinoid, urine                 Cutoff 50 ng/mL Barbiturates + metabolites, urine  Cutoff 200 ng/mL Benzodiazepine, urine              Cutoff 200 ng/mL Methadone, urine                   Cutoff 300 ng/mL  The urine drug screen provides only a preliminary, unconfirmed analytical test result and should not be used for non-medical purposes. Clinical consideration and professional judgment should be applied to any positive drug screen result due to possible interfering substances. A more specific alternate chemical method must be used in order to obtain a confirmed analytical result. Gas chromatography / mass spectrometry (GC/MS) is the preferred confirm atory method. Performed at [facility], [address] [place], [place] [address]     Current Facility-Administered Medications  Medication Dose Route Frequency Provider Last Rate Last Admin   LORazepam (ATIVAN) tablet 0.5 mg  0.5 mg Oral TID PRN Chales Abrahams, NP       OLANZapine (ZYPREXA) tablet 10 mg  10 mg Oral QHS Ophelia Shoulder E, NP   10 mg at 11/01/23 2108   No current outpatient medications on file.    Musculoskeletal: Strength & Muscle Tone:  sitting up on assessment Gait & Station:  sitting up on assessment Patient leans:  sitting up on assessment            Psychiatric Specialty  Exam:  Presentation  General Appearance:  Bizarre  Eye Contact: Minimal  Speech: Slow; Blocked  Speech Volume: Decreased  Handedness: Right   Mood and Affect  Mood: -- ("a little better")  Affect: Flat; Constricted   Thought Process  Thought Processes: Coherent  Descriptions of Associations:Intact  Orientation:Full (Time, Place and Person)  Thought Content:Paranoid Ideation  History of Schizophrenia/Schizoaffective disorder:No  Hallucinations:Hallucinations: None  Ideas of Reference:Paranoia  Suicidal Thoughts:Suicidal Thoughts: No  Homicidal Thoughts:Homicidal Thoughts: No   Sensorium  Memory: Immediate Fair  Judgment: Intact  Insight: Shallow   Executive Functions  Concentration: Poor  Attention Span: Fair  Recall: [employer] of Knowledge: Fair  Language: Fair   Psychomotor Activity  Psychomotor Activity: Psychomotor Activity: Decreased   Assets  Assets: Communication Skills; Financial Resources/Insurance; Desire for Improvement; Housing; Physical Health; Resilience; Social Support   Sleep  Sleep: Sleep: Fair  Physical Exam: Physical Exam Constitutional:      General: He is not in acute distress.    Appearance: He is not ill-appearing, toxic-appearing or diaphoretic.  Eyes:     General: No scleral icterus. Cardiovascular:     Rate and Rhythm: Normal rate.  Pulmonary:     Effort: Pulmonary effort is normal. No respiratory distress.  Neurological:     Mental Status: He is alert and oriented to person, place, and time.  Psychiatric:        Attention and Perception: Attention and perception normal.        Mood and Affect: Affect is flat.  Speech: Speech is delayed.        Behavior: Behavior is slowed. Behavior is cooperative.        Thought Content: Thought content is paranoid.    Review of Systems  Constitutional:  Negative for chills and fever.  Respiratory:  Negative for shortness of breath.    Cardiovascular:  Negative for chest pain and palpitations.  Gastrointestinal:  Negative for abdominal pain.  Neurological:  Negative for headaches.  Psychiatric/Behavioral:  Positive for substance abuse.    Blood pressure 126/84, pulse 99, temperature 98.3 F (36.8 C), temperature source Oral, resp. rate 20, SpO2 96%. There is no height or weight on file to calculate BMI.  Treatment Plan Summary: Daily contact with patient to assess and evaluate symptoms and progress in treatment, Medication management, and Plan    -Continue ativan 0.5mg  oral 3 times daily Prn anxiety -Continue zyprexa 10gm oral daily at bedtime -Pt recommended for inpatient psychiatric admission  Disposition: Recommend psychiatric Inpatient admission when medically cleared.  Lauree Chandler, NP 11/02/2023 9:29 AM

## 2023-11-02 NOTE — Group Note (Signed)
Date:  11/02/2023 Time:  5:47 PM  Group Topic/Focus:  Dimensions of Wellness:   The focus of this group is to introduce the topic of wellness and discuss the role each dimension of wellness plays in total health. Goals Group:   The focus of this group is to help patients establish daily goals to achieve during treatment and discuss how the patient can incorporate goal setting into their daily lives to aide in recovery.    Participation Level:  Did Not Attend   [name] 11/02/2023, 5:47 PM

## 2023-11-02 NOTE — [facility] Suicide Risk Assessment (Cosign Needed Addendum)
[facility] Admission Suicide Risk Assessment   Nursing information obtained from:  Patient Demographic factors:  Male Current Mental Status:  NA Loss Factors:  NA Historical Factors:  NA Risk Reduction Factors:  Sense of responsibility to family, Employed  Total Time spent with patient: 2 hours Principal Problem: Substance-induced psychotic disorder (HCC) Diagnosis:  Principal Problem:   Substance-induced psychotic disorder (HCC)  Subjective Data: 32 year old African American male presenting for telepsych evaluation after experiencing multiple panic attacks, including one triggered by hearing a song that brought back traumatic memories. He reports a history of depression, anxiety, and substance use (marijuana and mushrooms). He describes his mood as depressed and reports a lack of motivation and feelings of paranoia, though he cannot elaborate on the paranoia.He admits to past self-injurious behavior, including cutting his arm in 2017, which required sutures but was not accompanied by hospitalization. He minimizes the severity of his prior suicide attempts and reports they were not serious. He also reports taking a Valium given by his mother before admission, which he claims helped temporarily.The patient states he stopped taking Lexapro for anxiety due to insurance issues. He denies current suicidal or homicidal ideation, auditory or visual hallucinations, and physical pain but reports persistent depression for months. He expresses a desire to leave the hospital voluntarily, stating, "I am here voluntarily," but was advised about the need for a safe discharge plan.Collateral information from his stepfather indicates the patient called EMS due to a panic attack while leaving his home. His mother also contacted the police during the incident.   CLINICAL FACTORS:   Severe Anxiety and/or Agitation Depression:   Anhedonia Comorbid alcohol abuse/dependence Impulsivity Insomnia Alcohol/Substance  Abuse/Dependencies   Musculoskeletal: Strength & Muscle Tone: within normal limits Gait & Station: normal Patient leans: N/A  Psychiatric Specialty Exam:  Presentation  General Appearance:  Disheveled  Eye Contact: Poor  Speech: Slow; Clear and Coherent  Speech Volume: Decreased  Handedness: Right   Mood and Affect  Mood: Depressed; Hopeless  Affect: Depressed; Restricted   Thought Process  Thought Processes: Coherent  Descriptions of Associations:Intact  Orientation:Full (Time, Place and Person) (and situation)  Thought Content:WDL  History of Schizophrenia/Schizoaffective disorder:No  Duration of Psychotic Symptoms:Less than six months (2 months)  Hallucinations:Hallucinations: None Description of Auditory Hallucinations: denies Description of Visual Hallucinations: denies  Ideas of Reference:None  Suicidal Thoughts:Suicidal Thoughts: No  Homicidal Thoughts:Homicidal Thoughts: No   Sensorium  Memory: Immediate Fair; Remote Fair  Judgment: Poor  Insight: Poor   Executive Functions  Concentration: Poor  Attention Span: Fair  Recall: Good  Fund of Knowledge: Good  Language: Fair   Psychomotor Activity  Psychomotor Activity: Psychomotor Activity: Normal   Assets  Assets: Social Support; Manufacturing systems engineer; Financial Resources/Insurance   Sleep  Sleep: Sleep: Fair Number of Hours of Sleep: 6    Physical Exam: Physical Exam Vitals and nursing note reviewed.  Constitutional:      Appearance: Normal appearance.  HENT:     Head: Normocephalic.     Nose: Nose normal.  Pulmonary:     Effort: Pulmonary effort is normal.  Musculoskeletal:        General: Normal range of motion.     Cervical back: Normal range of motion.  Neurological:     General: No focal deficit present.     Mental Status: He is alert and oriented to person, place, and time.  Psychiatric:        Attention and Perception: Perception  normal. He is inattentive.  Mood and Affect: Mood is anxious and depressed. Affect is flat.        Speech: Speech normal.        Behavior: Behavior is slowed and withdrawn. Behavior is cooperative.        Thought Content: Thought content normal.        Cognition and Memory: Cognition and memory normal.        Judgment: Judgment normal.    ROS Blood pressure 123/70, pulse 86, temperature 98.1 F (36.7 C), temperature source Oral, resp. rate 16, height 6\' 1"  (1.854 m), weight 68.9 kg, SpO2 98%. Body mass index is 20.05 kg/m.   COGNITIVE FEATURES THAT CONTRIBUTE TO RISK:  None    SUICIDE RISK:   Minimal: No identifiable suicidal ideation.  Patients presenting with no risk factors but with morbid ruminations; may be classified as minimal risk based on the severity of the depressive symptoms  PLAN OF CARE:  Lamictal 100 mg BID (twice daily) added to the treatment plan to address mood stabilization and symptoms of depression associated with Major Depressive Disorder or mood instability. Initiate Lexapro (escitalopram) 20 mg daily to address anxiety and depressive symptoms. Consider starting Hydroxyzine 25 mg PRN for acute anxiety. Evaluate need for antipsychotic medication if paranoia persists Continue exploring past self-injurious behaviors and suicidal ideation to assess risk. Psychoeducation on the risks of marijuana and mushroom use, particularly in relation to mental health and panic attacks. I certify that inpatient services furnished can reasonably be expected to improve the patient's condition.   Myriam Forehand, NP 11/02/2023, 7:53 PM

## 2023-11-02 NOTE — Plan of Care (Signed)

## 2023-11-02 NOTE — Progress Notes (Signed)
Admission note:  Consents signed, literature detailing the patient's rights, responsibilities, and visitor guidelines provided. Skin search performed unremarkable. His belongings search completed and no contraband found. He endorses anxiety and depression. He denies SI/HI/AVH. He states he is here because of "panic attacks" He states he had a trigger from something that happened to him as a child and he hasn't talked about it and doesn't want to. He is very soft spoken and forwards minimally. He states he did "mushrooms a long time ago and smokes pot but not often". He states he used to have a problem with alcohol but went to rehab and is in recovery. He was oriented to his room and the unit. No questions or concerns at present. Patient given the opportunity to express concerns and ask questions. He contracts for safety and remains safe on the unit at present.

## 2023-11-02 NOTE — ED Notes (Signed)
Vol/consult done/pt is recommend for overnight observation & AM reassessment.

## 2023-11-02 NOTE — Group Note (Signed)
Date:  11/02/2023 Time:  10:07 PM  Group Topic/Focus:  Wrap-Up Group:   The focus of this group is to help patients review their daily goal of treatment and discuss progress on daily workbooks.    Participation Level:  Active  Participation Quality:  Appropriate and Monopolizing  Affect:  Appropriate and Excited  Cognitive:  Alert and Appropriate  Insight: Appropriate  Engagement in Group:  Engaged  Modes of Intervention:  Discussion  Additional Comments:     Maglione,Salim Forero E 11/02/2023, 10:07 PM

## 2023-11-02 NOTE — Group Note (Signed)
LCSW Group Therapy Note  Group Date: 11/02/2023 Start Time: 1330 End Time: 1430   Type of Therapy and Topic:  Group Therapy - Healthy vs Unhealthy Coping Skills  Participation Level:  Did Not Attend   Description of Group The focus of this group was to determine what unhealthy coping techniques typically are used by group members and what healthy coping techniques would be helpful in coping with various problems. Patients were guided in becoming aware of the differences between healthy and unhealthy coping techniques. Patients were asked to identify 2-3 healthy coping skills they would like to learn to use more effectively.  Therapeutic Goals Patients learned that coping is what human beings do all day long to deal with various situations in their lives Patients defined and discussed healthy vs unhealthy coping techniques Patients identified their preferred coping techniques and identified whether these were healthy or unhealthy Patients determined 2-3 healthy coping skills they would like to become more familiar with and use more often. Patients provided support and ideas to each other   Summary of Patient Progress:  X   Therapeutic Modalities Cognitive Behavioral Therapy Motivational Interviewing  Harden Mo, Theresia Majors 11/02/2023  3:04 PM

## 2023-11-02 NOTE — Group Note (Signed)
Date:  11/02/2023 Time:  5:54 PM  Group Topic/Focus:  Healthy Communication:   The focus of this group is to discuss communication, barriers to communication, as well as healthy ways to communicate with others. Outdoor recreation structured group activity    Participation Level:  Active  Participation Quality:  Appropriate  Affect:  Appropriate  Cognitive:  Appropriate  Insight: Appropriate  Engagement in Group:  Developing/Improving  Modes of Intervention:  Activity  Additional Comments:    [name] 11/02/2023, 5:54 PM

## 2023-11-03 DIAGNOSIS — F19959 Other psychoactive substance use, unspecified with psychoactive substance-induced psychotic disorder, unspecified: Secondary | ICD-10-CM | POA: Diagnosis not present

## 2023-11-03 MED ORDER — ADULT MULTIVITAMIN W/MINERALS CH
1.0000 | ORAL_TABLET | Freq: Every day | ORAL | Status: DC
  Administered 2023-11-04 – 2023-11-08 (×5): 1 via ORAL
  Filled 2023-11-03 (×5): qty 1

## 2023-11-03 NOTE — Progress Notes (Signed)
Nutrition Brief Note  Patient identified on the Malnutrition Screening Tool (MST) Report  32 year old  African American male with a history of depression, anxiety, and substance use (marijuana and mushrooms) who presents after experiencing multiple panic attacks.    Wt Readings from Last 15 Encounters:  11/02/23 68.9 kg  02/09/23 70.2 kg  11/25/22 65.5 kg  10/22/22 64 kg  02/11/18 79.4 kg  01/20/18 79.4 kg  05/22/17 73 kg  08/01/16 74.8 kg  03/28/16 70.3 kg    Body mass index is 20.05 kg/m. Patient meets criteria for normal weight based on current BMI.   Current diet order is regular, patient is consuming approximately 100% of meals at this time. Labs and medications reviewed.   No nutrition interventions warranted at this time. If nutrition issues arise, please consult RD.   Betsey Holiday MS, RD, LDN Please refer to [facility] for RD and/or RD on-call/weekend/after hours pager

## 2023-11-03 NOTE — [facility] Suicide Risk Assessment (Signed)
[facility] INPATIENT:  Family/Significant Other Suicide Prevention Education  Suicide Prevention Education:  Education Completed;  Pollie Friar, mother, [phone] has been identified by the patient as the family member/significant other with whom the patient will be residing, and identified as the person(s) who will aid the patient in the event of a mental health crisis (suicidal ideations/suicide attempt).  With written consent from the patient, the family member/significant other has been provided the following suicide prevention education, prior to the and/or following the discharge of the patient.  The suicide prevention education provided includes the following: Suicide risk factors Suicide prevention and interventions National Suicide Hotline telephone number [facility] assessment telephone number [facility] Emergency Assistance 911 [facility] and/or Residential Mobile Crisis Unit telephone number  Request made of family/significant other to: Remove weapons (e.g., guns, rifles, knives), all items previously/currently identified as safety concern.   Remove drugs/medications (over-the-counter, prescriptions, illicit drugs), all items previously/currently identified as a safety concern.  The family member/significant other verbalizes understanding of the suicide prevention education information provided.  The family member/significant other agrees to remove the items of safety concern listed above.  Harden Mo 11/03/2023, 1:00 PM

## 2023-11-03 NOTE — Group Note (Signed)
Recreation Therapy Group Note   Group Topic:Goal Setting  Group Date: 11/03/2023 Start Time: 1000 End Time: 1100 Facilitators: Rosina Lowenstein, LRT, CTRS Location:  Craft Room  Group Description: Product/process development scientist. Patients were given many different magazines, a glue stick, markers, and a piece of cardstock paper. LRT and pts discussed the importance of having goals in life. LRT and pts discussed the difference between short-term and long-term goals, as well as what a SMART goal is. LRT encouraged pts to create a vision board, with images they picked and then cut out with safety scissors from the magazine, for themselves, that capture their short and long-term goals. LRT encouraged pts to show and explain their vision board to the group.   Goal Area(s) Addressed:  Patient will gain knowledge of short vs. long term goals.  Patient will identify goals for themselves. Patient will practice setting SMART goals. Patient will verbalize their goals to LRT and peers.   Affect/Mood: Appropriate and Flat   Participation Level: Active and Engaged   Participation Quality: Independent   Behavior: Calm and Cooperative   Speech/Thought Process: Coherent   Insight: Fair   Judgement: Good   Modes of Intervention: Art and Education   Patient Response to Interventions:  Attentive and Receptive   Education Outcome:  Acknowledges education   Clinical Observations/Individualized Feedback: Petar was active in their participation of session activities and group discussion. Pt identified "get medicine for anxiety and to use my time wisely" as his goals. Pt minimally interacted well with LRT and peers duration of session, however, appropriately identified images to reflect these goals.    Plan: Continue to engage patient in RT group sessions 2-3x/week.   Rosina Lowenstein, LRT, CTRS 11/03/2023 11:52 AM

## 2023-11-03 NOTE — Group Note (Signed)
Date:  11/03/2023 Time:  1:50 PM  Group Topic/Focus:  Emotional Education:   The focus of this group is to discuss what feelings/emotions are, and how they are experienced. Recovery Goals:   The focus of this group is to identify appropriate goals for recovery and establish a plan to achieve them.    Participation Level:  Active  Participation Quality:  Appropriate  Affect:  Appropriate  Cognitive:  Appropriate  Insight: Appropriate  Engagement in Group:  Developing/Improving, Engaged, and Improving  Modes of Intervention:  Activity, Discussion, and Education  Additional Comments:    [name] 11/03/2023, 1:50 PM

## 2023-11-03 NOTE — Progress Notes (Signed)
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 Changed from a voluntary admission today by NP. Denies SI, HI, AVH, Anxiety and depression but presents as.. paranoid? - very worried over everything, questioning medications and why he is taking them, even when he received them before. Also comes off as anxiety and shaky but denies. Comes out in the group to eat but otherwise mostly in his room.  Will continue to monitor.

## 2023-11-03 NOTE — Progress Notes (Incomplete)
[facility] MD Progress Note  11/03/2023 4:31 PM Lance Fox  MRN:  [id number] Subjective:  *** Principal Problem: Substance-induced psychotic disorder (HCC) Diagnosis: Principal Problem:   Substance-induced psychotic disorder (HCC)  Total Time spent with patient: {Time; 15 min - 8 hours:17441}  Past Psychiatric History: ***  Past Medical History:  Past Medical History:  Diagnosis Date   ADHD (attention deficit hyperactivity disorder)    Anxiety     Past Surgical History:  Procedure Laterality Date   TONSILLECTOMY     Family History:  Family History  Problem Relation Age of Onset   ADD / ADHD Mother    Anxiety disorder Mother    Heart failure Other    Family Psychiatric  History: *** Social History:  Social History   Substance and Sexual Activity  Alcohol Use No     Social History   Substance and Sexual Activity  Drug Use Yes   Types: Marijuana   Comment: occasional    Social History   Socioeconomic History   Marital status: Single    Spouse name: Not on file   Number of children: Not on file   Years of education: Not on file   Highest education level: Some college, no degree  Occupational History   Not on file  Tobacco Use   Smoking status: Former    Current packs/day: 0.00    Types: Cigarettes    Quit date: 2021    Years since quitting: 3.9   Smokeless tobacco: Never  Vaping Use   Vaping status: Every Day   Substances: Nicotine, Mixture of cannabinoids  Substance and Sexual Activity   Alcohol use: No   Drug use: Yes    Types: Marijuana    Comment: occasional   Sexual activity: Not Currently  Other Topics Concern   Not on file  Social History Narrative   Not on file   Social Determinants of Health   Financial Resource Strain: Not on file  Food Insecurity: No Food Insecurity (11/02/2023)   Hunger Vital Sign    Worried About Running Out of Food in the Last Year: Never true    Ran Out of Food in the Last Year: Never true  Transportation  Needs: Unknown (11/02/2023)   PRAPARE - Administrator, Civil Service (Medical): Not on file    Lack of Transportation (Non-Medical): No  Physical Activity: Not on file  Stress: Not on file (10/15/2023)  Social Connections: Not on file   Additional Social History:                         Sleep: {B[facility] GOOD/FAIR/POOR:22877}  Appetite:  {B[facility] GOOD/FAIR/POOR:22877}  Current Medications: Current Facility-Administered Medications  Medication Dose Route Frequency Provider Last Rate Last Admin   acetaminophen (TYLENOL) tablet 650 mg  650 mg Oral Q6H PRN Lauree Chandler, NP       alum & mag hydroxide-simeth (MAALOX/MYLANTA) 200-200-20 MG/5ML suspension 30 mL  30 mL Oral Q4H PRN Lauree Chandler, NP       escitalopram (LEXAPRO) tablet 20 mg  20 mg Oral Daily Myriam Forehand, NP   20 mg at 11/03/23 0839   feeding supplement (ENSURE ENLIVE / ENSURE PLUS) liquid 237 mL  237 mL Oral BID BM Sarina Ill, DO   237 mL at 11/03/23 1500   hydrOXYzine (ATARAX) tablet 25 mg  25 mg Oral TID PRN Myriam Forehand, NP       lamoTRIgine (  LAMICTAL) tablet 100 mg  100 mg Oral BID Myriam Forehand, NP   100 mg at 11/03/23 1716   magnesium hydroxide (MILK OF MAGNESIA) suspension 30 mL  30 mL Oral Daily PRN Lauree Chandler, NP       Melene Muller ON 11/04/2023] multivitamin with minerals tablet 1 tablet  1 tablet Oral Daily Sarina Ill, DO       OLANZapine [facility]) tablet 10 mg  10 mg Oral QHS Lauree Chandler, NP   10 mg at 11/02/23 2124   OLANZapine zydis (ZYPREXA) disintegrating tablet 5 mg  5 mg Oral TID PRN Lauree Chandler, NP       traZODone (DESYREL) tablet 100 mg  100 mg Oral QHS Myriam Forehand, NP   100 mg at 11/02/23 2124    Lab Results: No results found for this or any previous visit (from the past 48 hour(s)).  Blood Alcohol level:  Lab Results  Component Value Date   ETH 169 (H) 03/29/2016    Metabolic Disorder Labs: No results found for: "HGBA1C",  "MPG" No results found for: "PROLACTIN" Lab Results  Component Value Date   CHOL 151 11/25/2022   TRIG 45 11/25/2022   HDL 59 11/25/2022   CHOLHDL 2.6 11/25/2022   LDLCALC 82 11/25/2022    Physical Findings: AIMS:  , ,  ,  ,    CIWA:    COWS:     Musculoskeletal: Strength & Muscle Tone: {desc; muscle tone:32375} Gait & Station: {PE GAIT ED ZOXW:96045} Patient leans: {Patient Leans:21022755}  Psychiatric Specialty Exam:  Presentation  General Appearance:  Disheveled  Eye Contact: Poor  Speech: Slow; Clear and Coherent  Speech Volume: Decreased  Handedness: Right   Mood and Affect  Mood: Depressed; Hopeless  Affect: Depressed; Restricted   Thought Process  Thought Processes: Coherent  Descriptions of Associations:Intact  Orientation:Full (Time, Place and Person) (and situation)  Thought Content:WDL  History of Schizophrenia/Schizoaffective disorder:No  Duration of Psychotic Symptoms:Less than six months (2 months)  Hallucinations:Hallucinations: None Description of Auditory Hallucinations: denies Description of Visual Hallucinations: denies  Ideas of Reference:None  Suicidal Thoughts:Suicidal Thoughts: No  Homicidal Thoughts:Homicidal Thoughts: No   Sensorium  Memory: Immediate Fair; Remote Fair  Judgment: Poor  Insight: Poor   Executive Functions  Concentration: Poor  Attention Span: Fair  Recall: Good  Fund of Knowledge: Good  Language: Fair   Psychomotor Activity  Psychomotor Activity: Psychomotor Activity: Normal   Assets  Assets: Social Support; Manufacturing systems engineer; Financial Resources/Insurance   Sleep  Sleep: Sleep: Fair Number of Hours of Sleep: 6    Physical Exam: Physical Exam ROS Blood pressure (!) 148/87, pulse 91, temperature 97.7 F (36.5 C), temperature source Oral, resp. rate 17, height 6\' 1"  (1.854 m), weight 68.9 kg, SpO2 98%. Body mass index is 20.05 kg/m.   Treatment Plan  Summary: {CHL [facility] MD [place]. WUJW:[id number]}  Myriam Forehand, NP 11/03/2023, 6:31 PM
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
## 2023-11-03 NOTE — BHH Suicide Risk Assessment (Signed)
B[facility] INPATIENT:  Family/Significant Other Suicide Prevention Education  Suicide Prevention Education:  Contact Attempts:  Pollie Friar, mother, [phone] has been identified by the patient as the family member/significant other with whom the patient will be residing, and identified as the person(s) who will aid the patient in the event of a mental health crisis.  With written consent from the patient, two attempts were made to provide suicide prevention education, prior to and/or following the patient's discharge.  We were unsuccessful in providing suicide prevention education.  A suicide education pamphlet was given to the patient to share with family/significant other.  Date and time of first attempt: 11/03/2023 at 12:56PM Date and time of second attempt: Second attempt is needed.  CSW left HIPAA compliant voicemail.  Harden Mo 11/03/2023, 12:55 PM

## 2023-11-03 NOTE — Group Note (Signed)
Date:  11/03/2023 Time:  10:28 PM  Group Topic/Focus:  Personal Choices and Values:   The focus of this group is to help patients assess and explore the importance of values in their lives, how their values affect their decisions, how they express their values and what opposes their expression.    Participation Level:  Active  Participation Quality:  Appropriate and Attentive  Affect:  Appropriate  Cognitive:  Alert and Appropriate  Insight: Appropriate  Engagement in Group:  Engaged and Improving  Modes of Intervention:  Activity, Clarification, Discussion, Rapport Building, Socialization, and Support  Additional Comments:     Odyssey Vasbinder 11/03/2023, 10:28 PM
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
## 2023-11-03 NOTE — Group Note (Signed)
[facility] LCSW Group Therapy Note   Group Date: 11/03/2023 Start Time: 1300 End Time: 1410  Type of Therapy/Topic:  Group Therapy:  Feelings about Diagnosis  Participation Level:  Minimal   Mood: Appropriate    Description of Group:    This group will allow patients to explore their thoughts and feelings about diagnoses they have received. Patients will be guided to explore their level of understanding and acceptance of these diagnoses. Facilitator will encourage patients to process their thoughts and feelings about the reactions of others to their diagnosis, and will guide patients in identifying ways to discuss their diagnosis with significant others in their lives. This group will be process-oriented, with patients participating in exploration of their own experiences as well as giving and receiving support and challenge from other group members.   Therapeutic Goals: 1. Patient will demonstrate understanding of diagnosis as evidence by identifying two or more symptoms of the disorder:  2. Patient will be able to express two feelings regarding the diagnosis 3. Patient will demonstrate ability to communicate their needs through discussion and/or role plays  Summary of Patient Progress: During group, patient did not engage much in group conversation but was attentive to peers and group facilitator. Patient offered some perspective and added to the positive group dynamic and conversation. Patient appeared to be engaged and did work to offer peers empathy and mutual respect.    Therapeutic Modalities:   Cognitive Behavioral Therapy Brief Therapy Feelings Identification    Lowry Ram, LCSW

## 2023-11-03 NOTE — Group Note (Signed)
Date:  11/03/2023 Time:  6:24 PM  Group Topic/Focus:  Healthy Communication:   The focus of this group is to discuss communication, barriers to communication, as well as healthy ways to communicate with others. Outdoor recreation structured activity    Participation Level:  Did Not Attend   [name] 11/03/2023, 6:24 PM

## 2023-11-04 DIAGNOSIS — F19959 Other psychoactive substance use, unspecified with psychoactive substance-induced psychotic disorder, unspecified: Secondary | ICD-10-CM | POA: Diagnosis not present

## 2023-11-04 NOTE — BH IP Treatment Plan (Signed)
Interdisciplinary Treatment and Diagnostic Plan Update  11/04/2023 Time of Session: 9:55AM Lance Fox MRN: [id number]  Principal Diagnosis: Substance-induced psychotic disorder [facility])  Secondary Diagnoses: Principal Problem:   Substance-induced psychotic disorder (HCC)   Current Medications:  Current Facility-Administered Medications  Medication Dose Route Frequency Provider Last Rate Last Admin   acetaminophen (TYLENOL) tablet 650 mg  650 mg Oral Q6H PRN Lauree Chandler, NP       alum & mag hydroxide-simeth (MAALOX/MYLANTA) 200-200-20 MG/5ML suspension 30 mL  30 mL Oral Q4H PRN Lauree Chandler, NP       escitalopram (LEXAPRO) tablet 20 mg  20 mg Oral Daily Myriam Forehand, NP   20 mg at 11/03/23 0839   feeding supplement (ENSURE ENLIVE / ENSURE PLUS) liquid 237 mL  237 mL Oral BID BM Sarina Ill, DO   237 mL at 11/03/23 1500   hydrOXYzine (ATARAX) tablet 25 mg  25 mg Oral TID PRN Myriam Forehand, NP       lamoTRIgine (LAMICTAL) tablet 100 mg  100 mg Oral BID Myriam Forehand, NP   100 mg at 11/03/23 1716   magnesium hydroxide (MILK OF MAGNESIA) suspension 30 mL  30 mL Oral Daily PRN Lauree Chandler, NP       multivitamin with minerals tablet 1 tablet  1 tablet Oral Daily Sarina Ill, DO       OLANZapine [facility]) tablet 10 mg  10 mg Oral QHS Lauree Chandler, NP   10 mg at 11/03/23 2130   OLANZapine zydis (ZYPREXA) disintegrating tablet 5 mg  5 mg Oral TID PRN Lauree Chandler, NP       traZODone (DESYREL) tablet 100 mg  100 mg Oral QHS Myriam Forehand, NP   100 mg at 11/03/23 2130   PTA Medications: No medications prior to admission.    Patient Stressors: Traumatic event    Patient Strengths: Ability for insight  Average or above average intelligence  Arboriculturist fund of knowledge  Motivation for treatment/growth  Supportive family/friends  Work skills   Treatment Modalities: Medication Management,  Group therapy, Case management,  1 to 1 session with clinician, Psychoeducation, Recreational therapy.   Physician Treatment Plan for Primary Diagnosis: Substance-induced psychotic disorder (HCC) Long Term Goal(s): Improvement in symptoms so as ready for discharge   Short Term Goals: Ability to identify changes in lifestyle to reduce recurrence of condition will improve Ability to verbalize feelings will improve Ability to disclose and discuss suicidal ideas Ability to demonstrate self-control will improve Ability to identify and develop effective coping behaviors will improve Ability to maintain clinical measurements within normal limits will improve Compliance with prescribed medications will improve Ability to identify triggers associated with substance abuse/mental health issues will improve  Medication Management: Evaluate patient's response, side effects, and tolerance of medication regimen.  Therapeutic Interventions: 1 to 1 sessions, Unit Group sessions and Medication administration.  Evaluation of Outcomes: Not Met  Physician Treatment Plan for Secondary Diagnosis: Principal Problem:   Substance-induced psychotic disorder (HCC)  Long Term Goal(s): Improvement in symptoms so as ready for discharge   Short Term Goals: Ability to identify changes in lifestyle to reduce recurrence of condition will improve Ability to verbalize feelings will improve Ability to disclose and discuss suicidal ideas Ability to demonstrate self-control will improve Ability to identify and develop effective coping behaviors will improve Ability to maintain clinical measurements within normal limits will improve Compliance with prescribed medications  will improve Ability to identify triggers associated with substance abuse/mental health issues will improve     Medication Management: Evaluate patient's response, side effects, and tolerance of medication regimen.  Therapeutic Interventions: 1 to 1  sessions, Unit Group sessions and Medication administration.  Evaluation of Outcomes: Not Met   RN Treatment Plan for Primary Diagnosis: Substance-induced psychotic disorder (HCC) Long Term Goal(s): Knowledge of disease and therapeutic regimen to maintain health will improve  Short Term Goals: Ability to verbalize frustration and anger appropriately will improve, Ability to demonstrate self-control, Ability to participate in decision making will improve, Ability to verbalize feelings will improve, Ability to disclose and discuss suicidal ideas, and Ability to identify and develop effective coping behaviors will improve  Medication Management: RN will administer medications as ordered by provider, will assess and evaluate patient's response and provide education to patient for prescribed medication. RN will report any adverse and/or side effects to prescribing provider.  Therapeutic Interventions: 1 on 1 counseling sessions, Psychoeducation, Medication administration, Evaluate responses to treatment, Monitor vital signs and CBGs as ordered, Perform/monitor CIWA, COWS, AIMS and Fall Risk screenings as ordered, Perform wound care treatments as ordered.  Evaluation of Outcomes: Not Met   LCSW Treatment Plan for Primary Diagnosis: Substance-induced psychotic disorder [facility]) Long Term Goal(s): Safe transition to appropriate next level of care at discharge, Engage patient in therapeutic group addressing interpersonal concerns.  Short Term Goals: Engage patient in aftercare planning with referrals and resources, Increase social support, Increase ability to appropriately verbalize feelings, Increase emotional regulation, Facilitate acceptance of mental health diagnosis and concerns, Facilitate patient progression through stages of change regarding substance use diagnoses and concerns, and Increase skills for wellness and recovery  Therapeutic Interventions: Assess for all discharge needs, 1 to 1 time  with Social worker, Explore available resources and support systems, Assess for adequacy in community support network, Educate family and significant other(s) on suicide prevention, Complete Psychosocial Assessment, Interpersonal group therapy.  Evaluation of Outcomes: Not Met   Progress in Treatment: Attending groups: Yes. Participating in groups: Yes. and No. Taking medication as prescribed: Yes. and No. Toleration medication: Yes. and No. Family/Significant other contact made: Yes, individual(s) contacted:  CSW contacted patient's mother with patient's consent.  Patient understands diagnosis: Yes. Discussing patient identified problems/goals with staff: Yes. Medical problems stabilized or resolved: Yes. and No. Denies suicidal/homicidal ideation: Yes. Issues/concerns per patient self-inventory: No. Other: None  New problem(s) identified: No, Describe:  None  New Short Term/Long Term Goal(s):detox, elimination of symptoms of psychosis, medication management for mood stabilization; elimination of SI thoughts; development of comprehensive mental wellness/sobriety plan.    Patient Goals:  "Get put on medication for my ADHD, anxiety, and depression. Get into a routine."  Discharge Plan or Barriers: CSW to assist patient in the development of appropriate discharge plan.   Reason for Continuation of Hospitalization: Anxiety Depression Medication stabilization Suicidal ideation Withdrawal symptoms  Estimated Length of Stay: 1-7 days.   Last 3 [place] Suicide Severity Risk Score: Flowsheet Row Admission (Current) from 11/02/2023 in [facility]PATIENT BEH[facility] MEDICINE ED from 11/01/2023 in [facility] Emergency Department at [facility] Visit from 02/09/2023 in confidential department  C-SSRS RISK CATEGORY No Risk No Risk Low Risk       Last PHQ 2/9 Scores:    02/09/2023    9:08 AM 11/25/2022    9:57 AM 10/22/2022    1:49 PM  Depression screen PHQ 2/9  Decreased  Interest 1 0 0  Down, Depressed, Hopeless 1  0 0  PHQ - 2 Score 2 0 0  Altered sleeping 3 2 3   Tired, decreased energy 1 3 2   Change in appetite 1 1 2   Feeling bad or failure about yourself  0 0 0  Trouble concentrating 3 3 3   Moving slowly or fidgety/restless 0 0 1  Suicidal thoughts 0 0 0  PHQ-9 Score 10 9 11   Difficult doing work/chores Somewhat difficult Somewhat difficult Not difficult at all    Scribe for Treatment Team: Lowry Ram, LCSW 11/04/2023 11:25 AM

## 2023-11-04 NOTE — Group Note (Signed)
Date:  11/04/2023 Time:  10:07 AM  Group Topic/Focus:  Goals Group:   The focus of this group is to help patients establish daily goals to achieve during treatment and discuss how the patient can incorporate goal setting into their daily lives to aide in recovery.    Participation Level:  Active  Participation Quality:  Appropriate  Affect:  Appropriate  Cognitive:  Appropriate  Insight: Appropriate  Engagement in Group:  Engaged  Modes of Intervention:  Discussion, Education, and Support  Additional Comments:    Wilford Corner 11/04/2023, 10:07 AM

## 2023-11-04 NOTE — Plan of Care (Signed)
  Problem: Education: Goal: Knowledge of  General Education information/materials will improve Outcome: Progressing Goal: Emotional status will improve Outcome: Progressing Goal: Mental status will improve Outcome: Progressing   Problem: Activity: Goal: Interest or engagement in activities will improve Outcome: Not Progressing   Problem: Coping: Goal: Ability to verbalize frustrations and anger appropriately will improve Outcome: Progressing

## 2023-11-04 NOTE — Progress Notes (Signed)
[facility] MD Progress Note  11/05/2023 1:03 PM Lance Fox  MRN:  [id number] Subjective:  32 year old African American male with a history of panic attacks, reported today, "I will stay till Saturday, for the medication to work." He appears to acknowledge the need for continued treatment and expresses willingness to remain hospitalized for stabilization. The patient denies suicidal ideation (SI), homicidal ideation (HI), delusions, or hallucinations. Principal Problem: Substance-induced psychotic disorder (HCC) Diagnosis: Principal Problem:   Substance-induced psychotic disorder (HCC)  Total Time spent with patient: 1 hour  Past Psychiatric History: Polysubstance Abuse Anxiety ADHD   Past Medical History:  Past Medical History:  Diagnosis Date   ADHD (attention deficit hyperactivity disorder)    Anxiety     Past Surgical History:  Procedure Laterality Date   TONSILLECTOMY     Family History:  Family History  Problem Relation Age of Onset   ADD / ADHD Mother    Anxiety disorder Mother    Heart failure Other    Family Psychiatric  History:  see above  Social History:  Social History   Substance and Sexual Activity  Alcohol Use No     Social History   Substance and Sexual Activity  Drug Use Yes   Types: Marijuana   Comment: occasional    Social History   Socioeconomic History   Marital status: Single    Spouse name: Not on file   Number of children: Not on file   Years of education: Not on file   Highest education level: Some college, no degree  Occupational History   Not on file  Tobacco Use   Smoking status: Former    Current packs/day: 0.00    Types: Cigarettes    Quit date: 2021    Years since quitting: 3.9   Smokeless tobacco: Never  Vaping Use   Vaping status: Every Day   Substances: Nicotine, Mixture of cannabinoids  Substance and Sexual Activity   Alcohol use: No   Drug use: Yes    Types: Marijuana    Comment: occasional   Sexual activity:  Not Currently  Other Topics Concern   Not on file  Social History Narrative   Not on file   Social Determinants of Health   Financial Resource Strain: Not on file  Food Insecurity: No Food Insecurity (11/02/2023)   Hunger Vital Sign    Worried About Running Out of Food in the Last Year: Never true    Ran Out of Food in the Last Year: Never true  Transportation Needs: Unknown (11/02/2023)   PRAPARE - Administrator, Civil Service (Medical): Not on file    Lack of Transportation (Non-Medical): No  Physical Activity: Not on file  Stress: Not on file (10/15/2023)  Social Connections: Not on file   Additional Social History:                         Sleep: Fair  Appetite:  Good  Current Medications: Current Facility-Administered Medications  Medication Dose Route Frequency Provider Last Rate Last Admin   acetaminophen (TYLENOL) tablet 650 mg  650 mg Oral Q6H PRN Lauree Chandler, NP       alum & mag hydroxide-simeth (MAALOX/MYLANTA) 200-200-20 MG/5ML suspension 30 mL  30 mL Oral Q4H PRN Lauree Chandler, NP       busPIRone (BUSPAR) tablet 7.5 mg  7.5 mg Oral BID Myriam Forehand, NP       clonazePAM [facility]) tablet  1 mg  1 mg Oral Once Myriam Forehand, NP       escitalopram (LEXAPRO) tablet 20 mg  20 mg Oral Daily Myriam Forehand, NP   20 mg at 11/05/23 0841   feeding supplement (ENSURE ENLIVE / ENSURE PLUS) liquid 237 mL  237 mL Oral BID BM Sarina Ill, DO   237 mL at 11/04/23 1100   hydrOXYzine (ATARAX) tablet 25 mg  25 mg Oral TID PRN Myriam Forehand, NP   25 mg at 11/05/23 1647   lamoTRIgine (LAMICTAL) tablet 100 mg  100 mg Oral BID Myriam Forehand, NP   100 mg at 11/05/23 1647   magnesium hydroxide (MILK OF MAGNESIA) suspension 30 mL  30 mL Oral Daily PRN Lauree Chandler, NP       multivitamin with minerals tablet 1 tablet  1 tablet Oral Daily Sarina Ill, DO   1 tablet at 11/05/23 0841   OLANZapine (ZYPREXA) tablet 10 mg  10 mg  Oral QHS Lauree Chandler, NP   10 mg at 11/04/23 2141   OLANZapine zydis (ZYPREXA) disintegrating tablet 5 mg  5 mg Oral TID PRN Lauree Chandler, NP       traZODone (DESYREL) tablet 100 mg  100 mg Oral QHS Myriam Forehand, NP   100 mg at 11/04/23 2141    Lab Results: No results found for this or any previous visit (from the past 48 hour(s)).  Blood Alcohol level:  Lab Results  Component Value Date   ETH 169 (H) 03/29/2016    Metabolic Disorder Labs: No results found for: "HGBA1C", "MPG" No results found for: "PROLACTIN" Lab Results  Component Value Date   CHOL 151 11/25/2022   TRIG 45 11/25/2022   HDL 59 11/25/2022   CHOLHDL 2.6 11/25/2022   LDLCALC 82 11/25/2022    Physical Findings: AIMS:  , ,  ,  ,    CIWA:    COWS:     Musculoskeletal: Strength & Muscle Tone: within normal limits Gait & Station: normal Patient leans: N/A  Psychiatric Specialty Exam:  Presentation  General Appearance:  Neat; Well Groomed  Eye Contact: Minimal  Speech: Clear and Coherent; Slow  Speech Volume: Decreased  Handedness: Right   Mood and Affect  Mood: Anxious; Irritable  Affect: Blunt; Flat   Thought Process  Thought Processes: Coherent  Descriptions of Associations:Intact  Orientation:Full (Time, Place and Person)  Thought Content:WDL  History of Schizophrenia/Schizoaffective disorder:No  Duration of Psychotic Symptoms:Less than six months  Hallucinations:Hallucinations: None Description of Auditory Hallucinations: denies Description of Visual Hallucinations: denies  Ideas of Reference:None  Suicidal Thoughts:Suicidal Thoughts: No  Homicidal Thoughts:Homicidal Thoughts: No   Sensorium  Memory: Immediate Fair; Remote Fair  Judgment: Fair  Insight: Fair   Art therapist  Concentration: Good  Attention Span: Fair  Recall: Fair  Fund of Knowledge: Good  Language: Good   Psychomotor Activity  Psychomotor  Activity:Psychomotor Activity: Normal   Assets  Assets: Communication Skills; Housing; Social Support   Sleep  Sleep:Sleep: Fair Number of Hours of Sleep: 6    Physical Exam: Physical Exam Vitals and nursing note reviewed.  Constitutional:      Appearance: Normal appearance.  HENT:     Head: Normocephalic and atraumatic.     Nose: Nose normal.  Pulmonary:     Effort: Pulmonary effort is normal.  Musculoskeletal:        General: Normal range of motion.     Cervical back: Normal  range of motion.  Neurological:     Mental Status: He is alert.  Psychiatric:        Attention and Perception: Attention and perception normal.        Mood and Affect: Mood is anxious. Affect is flat.        Speech: Speech normal.        Behavior: Behavior is withdrawn.        Thought Content: Thought content normal.        Cognition and Memory: Cognition and memory normal.        Judgment: Judgment normal.    Review of Systems  Psychiatric/Behavioral:  The patient is nervous/anxious and has insomnia.   All other systems reviewed and are negative.  Blood pressure (!) 170/103, pulse (!) 103, temperature 98.2 F (36.8 C), resp. rate 18, height 6\' 1"  (1.854 m), weight 68.9 kg, SpO2 98%. Body mass index is 20.05 kg/m.   Treatment Plan Summary: Daily contact with patient to assess and evaluate symptoms and progress in treatment and Medication management Lamictal 100 mg BID (twice daily) added to the treatment plan to address mood stabilization and symptoms of depression associated with Major Depressive Disorder or mood instability.  Lexapro (escitalopram) 20 mg daily to address anxiety and depressive symptoms.  Myriam Forehand, NP 11/05/2023, 5:51 PM

## 2023-11-04 NOTE — Progress Notes (Signed)
Patient given a copy of his MAR, per his request.

## 2023-11-04 NOTE — Group Note (Signed)
Date:  11/04/2023 Time:  10:07 PM  Group Topic/Focus:  Wrap-Up Group:   The focus of this group is to help patients review their daily goal of treatment and discuss progress on daily workbooks.    Participation Level:  Active  Participation Quality:  Appropriate  Affect:  Appropriate  Cognitive:  Appropriate  Insight: Appropriate  Engagement in Group:  Engaged  Modes of Intervention:  Discussion   Lenore Cordia 11/04/2023, 10:07 PM

## 2023-11-04 NOTE — Plan of Care (Signed)

## 2023-11-04 NOTE — Group Note (Signed)
Date:  11/04/2023 Time:  5:27 PM  Group Topic/Focus:  Self Care:   The focus of this group is to help patients understand the importance of self-care in order to improve or restore emotional, physical, spiritual, interpersonal, and financial health.      Participation Level:  Did Not Attend   Lynelle Smoke [facility] 11/04/2023, 5:27 PM

## 2023-11-04 NOTE — Progress Notes (Signed)
   11/04/23 1600  Psych Admission Type (Psych Patients Only)  Admission Status Voluntary  Psychosocial Assessment  Patient Complaints Anxiety;Depression  Eye Contact Fair;Watchful  Facial Expression Flat  Affect Flat  Speech Logical/coherent;Soft  Interaction Assertive  Motor Activity Slow  Appearance/Hygiene Unremarkable  Behavior Characteristics Cooperative;Appropriate to situation  Mood Preoccupied;Pleasant  Aggressive Behavior  Effect No apparent injury  Thought Process  Coherency Circumstantial  Content WDL  Delusions None reported or observed  Perception WDL  Hallucination None reported or observed  Judgment WDL  Confusion None  Danger to Self  Current suicidal ideation? Denies  Danger to Others  Danger to Others None reported or observed  Danger to Others Abnormal  Harmful Behavior to others No threats or harm toward other people  Destructive Behavior No threats or harm toward property

## 2023-11-04 NOTE — Group Note (Signed)
Recreation Therapy Group Note   Group Topic:Emotion Expression  Group Date: 11/04/2023 Start Time: 1045 End Time: 1135 Facilitators: Rosina Lowenstein, LRT, CTRS Location:  Craft Room  Group Description: Gratitude Journaling. Patients and LRT discussed what gratitude means, how we can express it and what it means to [place], personally. LRT gave an education handout on the definition of gratitude that also gave different examples of gratitude exercises that they could try. One of the examples was "Gratitude Letter", which prompted patient to write a letter to someone they appreciate. LRT played soft music while everyone wrote their letter. Once letter was completed, LRT encouraged people to read their letter if they wanted to; or share who they wrote it to, at minimum. LRT and pts talked about the benefits of journaling and how it can be used as a positive coping skill. LRT and pts processed how showing gratitude towards themselves, and others can be applied to everyday life post-discharge. LRT offered journals to pts afterwards.   Goal Area(s) Addressed:  Patient will identify the definition of gratitude. Patient will learn different gratitude exercises. Patient will practice writing/journaling as a coping skill.  Patient will identify a new coping skill.   Affect/Mood: Appropriate and Flat   Participation Level: Active and Engaged   Participation Quality: Independent   Behavior: Calm and Cooperative   Speech/Thought Process: Coherent   Insight: Good   Judgement: Good   Modes of Intervention: Activity, Education, and Writing   Patient Response to Interventions:  Attentive, Receptive, and Requested additional information/resources    Education Outcome:  Acknowledges education   Clinical Observations/Individualized Feedback: Lance Fox was active in their participation of session activities and group discussion. Pt identified "my mom" as who he wrote the letter to. Pt received a journal  after group. Pt minimally interacted with LRT and peers duration of session.    Plan: Continue to engage patient in RT group sessions 2-3x/week.   Rosina Lowenstein, LRT, CTRS 11/04/2023 11:56 AM

## 2023-11-05 DIAGNOSIS — F19959 Other psychoactive substance use, unspecified with psychoactive substance-induced psychotic disorder, unspecified: Secondary | ICD-10-CM | POA: Diagnosis not present

## 2023-11-05 MED ORDER — BUSPIRONE HCL 5 MG PO TABS
7.5000 mg | ORAL_TABLET | Freq: Two times a day (BID) | ORAL | Status: DC
  Administered 2023-11-05 – 2023-11-08 (×6): 7.5 mg via ORAL
  Filled 2023-11-05 (×6): qty 2

## 2023-11-05 MED ORDER — CLONAZEPAM 1 MG PO TABS
1.0000 mg | ORAL_TABLET | Freq: Once | ORAL | Status: AC
  Administered 2023-11-05: 1 mg via ORAL
  Filled 2023-11-05: qty 1

## 2023-11-05 MED ORDER — PROPRANOLOL HCL 20 MG PO TABS
10.0000 mg | ORAL_TABLET | Freq: Every day | ORAL | Status: DC
Start: 2023-11-05 — End: 2023-11-06
  Administered 2023-11-05 – 2023-11-06 (×2): 10 mg via ORAL
  Filled 2023-11-05 (×2): qty 1

## 2023-11-05 NOTE — Progress Notes (Signed)
Pt is alert and oriented X4. Affect is bright/mood is congruent. Denies anxiety/depression as well as SI/HI/AVH. No pain at this time.   Scheduled medications administered to patient per MD orders. Support and encouragement provided. Routine safety checks conducted every 15 minutes without incident. Patient informed to notify staff with problems or concerns and verbalizes understanding.  No adverse drug noted. Pt is compliant with medications and treatment plan. Pt receptive, calm, cooperative and interacts well with others on the unit.Pt contracts for safety and remains safe on the unit vat this time.

## 2023-11-05 NOTE — Progress Notes (Signed)
[facility] MD Progress Note  11/05/2023 5:55 PM Lance Fox  MRN:  [id number] Subjective: 32 year old African American male with a history of panic attack reports feeling more anxious today and denies experiencing headaches. He denies suicidal ideation (SI), homicidal ideation (HI), delusions, or hallucinations. The patient appears to be engaging more with the group and participating in the milieu compared to the previous day. Principal Problem: Substance-induced psychotic disorder (HCC) Diagnosis: Principal Problem:   Substance-induced psychotic disorder (HCC)  Total Time spent with patient: 45 minutes  Past Psychiatric History:  Polysubstance Abuse Anxiety ADHD     Past Medical History:  Past Medical History:  Diagnosis Date   ADHD (attention deficit hyperactivity disorder)    Anxiety     Past Surgical History:  Procedure Laterality Date   TONSILLECTOMY     Family History:  Family History  Problem Relation Age of Onset   ADD / ADHD Mother    Anxiety disorder Mother    Heart failure Other    Family Psychiatric  History:   see above  Social History:  Social History   Substance and Sexual Activity  Alcohol Use No     Social History   Substance and Sexual Activity  Drug Use Yes   Types: Marijuana   Comment: occasional    Social History   Socioeconomic History   Marital status: Single    Spouse name: Not on file   Number of children: Not on file   Years of education: Not on file   Highest education level: Some college, no degree  Occupational History   Not on file  Tobacco Use   Smoking status: Former    Current packs/day: 0.00    Types: Cigarettes    Quit date: 2021    Years since quitting: 3.9   Smokeless tobacco: Never  Vaping Use   Vaping status: Every Day   Substances: Nicotine, Mixture of cannabinoids  Substance and Sexual Activity   Alcohol use: No   Drug use: Yes    Types: Marijuana    Comment: occasional   Sexual activity: Not Currently   Other Topics Concern   Not on file  Social History Narrative   Not on file   Social Determinants of Health   Financial Resource Strain: Not on file  Food Insecurity: No Food Insecurity (11/02/2023)   Hunger Vital Sign    Worried About Running Out of Food in the Last Year: Never true    Ran Out of Food in the Last Year: Never true  Transportation Needs: Unknown (11/02/2023)   PRAPARE - Administrator, Civil Service (Medical): Not on file    Lack of Transportation (Non-Medical): No  Physical Activity: Not on file  Stress: Not on file (10/15/2023)  Social Connections: Not on file   Additional Social History:                         Sleep: Good  Appetite:  Good  Current Medications: Current Facility-Administered Medications  Medication Dose Route Frequency Provider Last Rate Last Admin   acetaminophen (TYLENOL) tablet 650 mg  650 mg Oral Q6H PRN Lauree Chandler, NP       alum & mag hydroxide-simeth (MAALOX/MYLANTA) 200-200-20 MG/5ML suspension 30 mL  30 mL Oral Q4H PRN Lauree Chandler, NP       busPIRone (BUSPAR) tablet 7.5 mg  7.5 mg Oral BID Myriam Forehand, NP       clonazePAM (  KLONOPIN) tablet 1 mg  1 mg Oral Once Myriam Forehand, NP       escitalopram (LEXAPRO) tablet 20 mg  20 mg Oral Daily Myriam Forehand, NP   20 mg at 11/05/23 0841   feeding supplement (ENSURE ENLIVE / ENSURE PLUS) liquid 237 mL  237 mL Oral BID BM Sarina Ill, DO   237 mL at 11/04/23 1100   hydrOXYzine (ATARAX) tablet 25 mg  25 mg Oral TID PRN Myriam Forehand, NP   25 mg at 11/05/23 1647   lamoTRIgine (LAMICTAL) tablet 100 mg  100 mg Oral BID Myriam Forehand, NP   100 mg at 11/05/23 1647   magnesium hydroxide (MILK OF MAGNESIA) suspension 30 mL  30 mL Oral Daily PRN Lauree Chandler, NP       multivitamin with minerals tablet 1 tablet  1 tablet Oral Daily Sarina Ill, DO   1 tablet at 11/05/23 0841   OLANZapine (ZYPREXA) tablet 10 mg  10 mg Oral QHS Lauree Chandler, NP   10 mg at 11/04/23 2141   OLANZapine zydis (ZYPREXA) disintegrating tablet 5 mg  5 mg Oral TID PRN Lauree Chandler, NP       traZODone (DESYREL) tablet 100 mg  100 mg Oral QHS Myriam Forehand, NP   100 mg at 11/04/23 2141    Lab Results: No results found for this or any previous visit (from the past 48 hour(s)).  Blood Alcohol level:  Lab Results  Component Value Date   ETH 169 (H) 03/29/2016    Metabolic Disorder Labs: No results found for: "HGBA1C", "MPG" No results found for: "PROLACTIN" Lab Results  Component Value Date   CHOL 151 11/25/2022   TRIG 45 11/25/2022   HDL 59 11/25/2022   CHOLHDL 2.6 11/25/2022   LDLCALC 82 11/25/2022    Physical Findings: AIMS:  , ,  ,  ,    CIWA:    COWS:     Musculoskeletal: Strength & Muscle Tone: within normal limits Gait & Station: normal Patient leans: N/A  Psychiatric Specialty Exam:  Presentation  General Appearance:  Neat; Well Groomed  Eye Contact: Minimal  Speech: Clear and Coherent; Slow  Speech Volume: Decreased  Handedness: Right   Mood and Affect  Mood: Anxious; Irritable  Affect: Blunt; Flat   Thought Process  Thought Processes: Coherent  Descriptions of Associations:Intact  Orientation:Full (Time, Place and Person)  Thought Content:WDL  History of Schizophrenia/Schizoaffective disorder:No  Duration of Psychotic Symptoms:Less than six months  Hallucinations:Hallucinations: None Description of Auditory Hallucinations: denies Description of Visual Hallucinations: denies  Ideas of Reference:None  Suicidal Thoughts:Suicidal Thoughts: No  Homicidal Thoughts:Homicidal Thoughts: No   Sensorium  Memory: Immediate Fair; Remote Fair  Judgment: Fair  Insight: Fair   Art therapist  Concentration: Good  Attention Span: Fair  Recall: Fair  Fund of Knowledge: Good  Language: Good   Psychomotor Activity  Psychomotor Activity: Psychomotor  Activity: Normal   Assets  Assets: Communication Skills; Housing; Social Support   Sleep  Sleep: Sleep: Fair Number of Hours of Sleep: 6    Physical Exam: Physical Exam Vitals and nursing note reviewed.  Constitutional:      Appearance: Normal appearance.  HENT:     Head: Normocephalic and atraumatic.     Nose: Nose normal.  Pulmonary:     Effort: Pulmonary effort is normal.  Musculoskeletal:     Cervical back: Normal range of motion.  Neurological:  General: No focal deficit present.     Mental Status: He is alert and oriented to person, place, and time. Mental status is at baseline.  Psychiatric:        Attention and Perception: Attention and perception normal.        Mood and Affect: Mood is anxious. Affect is flat.        Speech: Speech normal.        Behavior: Behavior is withdrawn. Behavior is cooperative.        Thought Content: Thought content normal.        Cognition and Memory: Cognition and memory normal.        Judgment: Judgment normal.    ROS Blood pressure (!) 170/103, pulse (!) 103, temperature 98.2 F (36.8 C), resp. rate 18, height 6\' 1"  (1.854 m), weight 68.9 kg, SpO2 98%. Body mass index is 20.05 kg/m.   Treatment Plan Summary: Daily contact with patient to assess and evaluate symptoms and progress in treatment and Medication management Lamictal 100 mg BID (twice daily) added to the treatment plan to address mood stabilization and symptoms of depression associated with Major Depressive Disorder or mood instability.  Lexapro (escitalopram) 20 mg daily to address anxiety and depressive symptom Klonopin 1 mg once daily to manage acute anxiety symptoms. Initiate Buspar (Buspirone) 7.5 mg BID for ongoing anxiety management. Start Propranolol 10 mg daily to address physical symptoms of anxiety (e.g., tachycardia). Myriam Forehand, NP 11/05/2023, 5:55 PM

## 2023-11-05 NOTE — Group Note (Signed)
Date:  11/05/2023 Time:  7:07 PM  Group Topic/Focus:  Activity Group:  The focus of the group is to promote activity with the patients to encourage them to go outside to the courtyard to get some fresh air and some exercise.     Participation Level:  Did Not Attend   Lance Fox Lance Fox 11/05/2023, 7:07 PM

## 2023-11-05 NOTE — Plan of Care (Signed)
Problem: Education: Goal: Emotional status will improve Outcome: Progressing   Problem: Education: Goal: Mental status will improve Outcome: Progressing

## 2023-11-05 NOTE — Progress Notes (Signed)
Nursing Shift Note:  1900-0700  Attended Evening Group: yes Medication Compliant:  yes Behavior: cooperative Sleep Quality: good Significant Changes: none noted Pt was active in the milieu with no c/o distress.  Denies SI Hi AVH.  Continued monitoring for safety.    11/05/23 0600  Psych Admission Type (Psych Patients Only)  Admission Status Voluntary  Psychosocial Assessment  Patient Complaints Anxiety  Eye Contact Fair  Facial Expression Flat  Affect Flat  Speech Logical/coherent  Interaction Assertive  Motor Activity Slow  Appearance/Hygiene Unremarkable  Behavior Characteristics Cooperative  Mood Preoccupied  Thought Process  Coherency WDL  Content WDL  Delusions None reported or observed  Perception WDL  Hallucination None reported or observed  Judgment WDL  Confusion None  Danger to Self  Current suicidal ideation? Denies  Danger to Others  Danger to Others None reported or observed  Danger to Others Abnormal  Harmful Behavior to others No threats or harm toward other people  Destructive Behavior No threats or harm toward property

## 2023-11-05 NOTE — Group Note (Signed)
Date:  11/05/2023 Time:  10:19 AM  Group Topic/Focus:  Goals Group:   The focus of this group is to help patients establish daily goals to achieve during treatment and discuss how the patient can incorporate goal setting into their daily lives to aide in recovery.    Participation Level:  Active  Participation Quality:  Appropriate  Affect:  Appropriate  Cognitive:  Appropriate  Insight: Appropriate  Engagement in Group:  Engaged  Modes of Intervention:  Discussion, Education, and Support  Additional Comments:    Lance Fox 11/05/2023, 10:19 AM

## 2023-11-05 NOTE — Plan of Care (Signed)

## 2023-11-05 NOTE — Progress Notes (Signed)
Patient given PRN anxiety medication to help with HTN. Earlene Plater, NP was notified for new orders.  BP will be re-checked.   11/05/23 1646  Vital Signs  Pulse Rate (!) 103  BP (!) 170/103  BP Location Right Arm  BP Method Automatic  Patient Position (if appropriate) Standing

## 2023-11-05 NOTE — Progress Notes (Signed)
   11/05/23 1700  Psych Admission Type (Psych Patients Only)  Admission Status Voluntary  Psychosocial Assessment  Patient Complaints Anxiety;Depression  Eye Contact Fair;Watchful  Facial Expression Flat  Affect Flat  Speech Logical/coherent;Soft  Interaction Assertive;Isolative (isolative to room, except for meals and medication)  Motor Activity Slow  Appearance/Hygiene Unremarkable  Behavior Characteristics Cooperative;Appropriate to situation  Mood Pleasant  Aggressive Behavior  Effect No apparent injury  Thought Process  Coherency Circumstantial  Content WDL  Delusions None reported or observed  Perception WDL  Hallucination None reported or observed  Judgment WDL  Confusion None  Danger to Self  Current suicidal ideation? Denies  Danger to Others  Danger to Others None reported or observed  Danger to Others Abnormal  Harmful Behavior to others No threats or harm toward other people  Destructive Behavior No threats or harm toward property

## 2023-11-05 NOTE — Progress Notes (Signed)
BP re-check prior to administering new scheduled medications.   11/05/23 1814  Vital Signs  Pulse Rate 67  BP (!) 149/99  BP Location Left Arm  BP Method Automatic  Patient Position (if appropriate) Sitting

## 2023-11-06 ENCOUNTER — Other Ambulatory Visit: Payer: Self-pay

## 2023-11-06 DIAGNOSIS — F19959 Other psychoactive substance use, unspecified with psychoactive substance-induced psychotic disorder, unspecified: Secondary | ICD-10-CM | POA: Diagnosis not present

## 2023-11-06 DIAGNOSIS — F410 Panic disorder [episodic paroxysmal anxiety] without agoraphobia: Secondary | ICD-10-CM | POA: Insufficient documentation

## 2023-11-06 DIAGNOSIS — F339 Major depressive disorder, recurrent, unspecified: Secondary | ICD-10-CM

## 2023-11-06 DIAGNOSIS — F4310 Post-traumatic stress disorder, unspecified: Secondary | ICD-10-CM

## 2023-11-06 MED ORDER — ESCITALOPRAM OXALATE 20 MG PO TABS
20.0000 mg | ORAL_TABLET | Freq: Every day | ORAL | 0 refills | Status: DC
  Filled 2023-11-06: qty 30, 30d supply, fill #0

## 2023-11-06 MED ORDER — BUSPIRONE HCL 7.5 MG PO TABS
7.5000 mg | ORAL_TABLET | Freq: Two times a day (BID) | ORAL | 0 refills | Status: AC
  Filled 2023-11-06: qty 60, 30d supply, fill #0

## 2023-11-06 MED ORDER — HYDROXYZINE HCL 25 MG PO TABS
25.0000 mg | ORAL_TABLET | Freq: Three times a day (TID) | ORAL | 0 refills | Status: AC | PRN
  Filled 2023-11-06: qty 30, 10d supply, fill #0

## 2023-11-06 MED ORDER — PROPRANOLOL HCL 20 MG PO TABS
20.0000 mg | ORAL_TABLET | Freq: Every day | ORAL | Status: DC
  Administered 2023-11-07 – 2023-11-08 (×2): 20 mg via ORAL
  Filled 2023-11-06 (×2): qty 1

## 2023-11-06 MED ORDER — LAMOTRIGINE 100 MG PO TABS
100.0000 mg | ORAL_TABLET | Freq: Two times a day (BID) | ORAL | 0 refills | Status: DC
  Filled 2023-11-06: qty 6, 3d supply, fill #0

## 2023-11-06 MED ORDER — OLANZAPINE 10 MG PO TABS
10.0000 mg | ORAL_TABLET | Freq: Every day | ORAL | 0 refills | Status: DC
  Filled 2023-11-06: qty 30, 30d supply, fill #0

## 2023-11-06 MED ORDER — PROPRANOLOL HCL 20 MG PO TABS
20.0000 mg | ORAL_TABLET | Freq: Every day | ORAL | 0 refills | Status: AC
  Filled 2023-11-06: qty 30, 30d supply, fill #0

## 2023-11-06 MED ORDER — ADULT MULTIVITAMIN W/MINERALS CH
1.0000 | ORAL_TABLET | Freq: Every day | ORAL | 0 refills | Status: AC
  Filled 2023-11-06: qty 30, 30d supply, fill #0

## 2023-11-06 MED ORDER — TRAZODONE HCL 100 MG PO TABS
100.0000 mg | ORAL_TABLET | Freq: Every day | ORAL | 0 refills | Status: DC
  Filled 2023-11-06: qty 30, 30d supply, fill #0

## 2023-11-06 NOTE — Plan of Care (Signed)

## 2023-11-06 NOTE — Group Note (Signed)
Date:  11/06/2023 Time:  5:28 PM  Group Topic/Focus:  Activity Group:  The focus of the group is to promote activity for the patients and encourage them to go outside in the courtyard and get some fresh air and some exercise.    Participation Level:  Did Not Attend   Lance Fox 11/06/2023, 5:28 PM

## 2023-11-06 NOTE — Group Note (Signed)
Date:  11/06/2023 Time:  5:25 PM  Group Topic/Focus:  Crisis Planning:   The purpose of this group is to help patients create a crisis plan for use upon discharge or in the future, as needed.    Participation Level:  Active  Participation Quality:  Appropriate  Affect:  Appropriate  Cognitive:  Appropriate  Insight: Appropriate  Engagement in Group:  Engaged  Modes of Intervention:  Activity  Additional Comments:    Mary Sella Miguelangel Korn 11/06/2023, 5:25 PM
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
## 2023-11-06 NOTE — Progress Notes (Incomplete)
[facility] MD Progress Note  11/06/2023 4:41 PM Lance Fox  MRN:  [id number] Subjective:  32 year old African American male who reports, "Having past thoughts and I got overwhelmed" and "I thought about my cousin."Expressed interest in processing trauma: "I want to talk to someone about my trauma when I leave.".Denies suicidal ideation (SI), homicidal ideation (HI), or delusions.No evidence of acute distress, psychosis, or agitation observed.Patient expresses emotional distress related to past trauma but demonstrates insight and willingness to seek help.No current SI, HI, or psychotic symptoms, indicating stability.Focus remains on addressing unresolved trauma and emotional overwhelm. 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)   Major depression, recurrent (HCC)  Total Time spent with patient: 1 hour  Past Psychiatric History: Polysubstance Abuse Anxiety ADHD     Past Medical History:  Past Medical History:  Diagnosis Date   ADHD (attention deficit hyperactivity disorder)    Anxiety     Past Surgical History:  Procedure Laterality Date   TONSILLECTOMY     Family History:  Family History  Problem Relation Age of Onset   ADD / ADHD Mother    Anxiety disorder Mother    Heart failure Other    Family Psychiatric  History: see above Social History:  Social History   Substance and Sexual Activity  Alcohol Use No     Social History   Substance and Sexual Activity  Drug Use Yes   Types: Marijuana   Comment: occasional    Social History   Socioeconomic History   Marital status: Single    Spouse name: Not on file   Number of children: Not on file   Years of education: Not on file   Highest education level: Some college, no degree  Occupational History   Not on file  Tobacco Use   Smoking status:  Former    Current packs/day: 0.00    Types: Cigarettes    Quit date: 2021    Years since quitting: 3.9   Smokeless tobacco: Never  Vaping Use   Vaping status: Every Day   Substances: Nicotine, Mixture of cannabinoids  Substance and Sexual Activity   Alcohol use: No   Drug use: Yes    Types: Marijuana    Comment: occasional   Sexual activity: Not Currently  Other Topics Concern   Not on file  Social History Narrative   Not on file   Social Determinants of Health   Financial Resource Strain: Not on file  Food Insecurity: No Food Insecurity (11/02/2023)   Hunger Vital Sign    Worried About Running Out of Food in the Last Year: Never true    Ran Out of Food in the Last Year: Never true  Transportation Needs: Unknown (11/02/2023)   PRAPARE - Administrator, Civil Service (Medical): Not on file    Lack of Transportation (Non-Medical): No  Physical Activity: Not on file  Stress: Not on file (10/15/2023)  Social Connections: Not on file   Additional Social History:                         Sleep: Fair  Appetite:  Good  Current Medications: Current Facility-Administered Medications  Medication Dose Route Frequency Provider Last Rate Last Admin   acetaminophen (TYLENOL) tablet 650 mg  650 mg Oral Q6H PRN Lauree Chandler, NP  alum & mag hydroxide-simeth (MAALOX/MYLANTA) 200-200-20 MG/5ML suspension 30 mL  30 mL Oral Q4H PRN Lauree Chandler, NP       busPIRone (BUSPAR) tablet 7.5 mg  7.5 mg Oral BID Myriam Forehand, NP   7.5 mg at 11/06/23 0851   escitalopram (LEXAPRO) tablet 20 mg  20 mg Oral Daily Myriam Forehand, NP   20 mg at 11/06/23 0851   feeding supplement (ENSURE ENLIVE / ENSURE PLUS) liquid 237 mL  237 mL Oral BID BM Sarina Ill, DO   237 mL at 11/04/23 1100   hydrOXYzine (ATARAX) tablet 25 mg  25 mg Oral TID PRN Myriam Forehand, NP   25 mg at 11/05/23 1647   lamoTRIgine (LAMICTAL) tablet 100 mg  100 mg Oral BID Myriam Forehand, NP    100 mg at 11/06/23 0851   magnesium hydroxide (MILK OF MAGNESIA) suspension 30 mL  30 mL Oral Daily PRN Lauree Chandler, NP       multivitamin with minerals tablet 1 tablet  1 tablet Oral Daily Sarina Ill, DO   1 tablet at 11/06/23 0851   OLANZapine (ZYPREXA) tablet 10 mg  10 mg Oral QHS Lauree Chandler, NP   10 mg at 11/05/23 2127   OLANZapine zydis (ZYPREXA) disintegrating tablet 5 mg  5 mg Oral TID PRN Lauree Chandler, NP       Melene Muller ON 11/07/2023] propranolol (INDERAL) tablet 20 mg  20 mg Oral Daily Myriam Forehand, NP       traZODone (DESYREL) tablet 100 mg  100 mg Oral QHS Myriam Forehand, NP   100 mg at 11/05/23 2127    Lab Results: No results found for this or any previous visit (from the past 48 hour(s)).  Blood Alcohol level:  Lab Results  Component Value Date   ETH 169 (H) 03/29/2016    Metabolic Disorder Labs: No results found for: "HGBA1C", "MPG" No results found for: "PROLACTIN" Lab Results  Component Value Date   CHOL 151 11/25/2022   TRIG 45 11/25/2022   HDL 59 11/25/2022   CHOLHDL 2.6 11/25/2022   LDLCALC 82 11/25/2022    Physical Findings: AIMS:  , ,  ,  ,    CIWA:    COWS:     Musculoskeletal: Strength & Muscle Tone: within normal limits Gait & Station: normal Patient leans: N/A  Psychiatric Specialty Exam:  Presentation  General Appearance:  Appropriate for Environment; Neat  Eye Contact: Minimal  Speech: Clear and Coherent  Speech Volume: Decreased  Handedness: Right   Mood and Affect  Mood: Anxious  Affect: Blunt; Flat   Thought Process  Thought Processes: Coherent  Descriptions of Associations:Intact  Orientation:Full (Time, Place and Person) (and situation)  Thought Content:WDL  History of Schizophrenia/Schizoaffective disorder:No  Duration of Psychotic Symptoms:Less than six months  Hallucinations:No data recorded Ideas of Reference:None  Suicidal Thoughts:No data recorded Homicidal  Thoughts:No data recorded  Sensorium  Memory: Immediate Fair; Remote Fair  Judgment: Fair  Insight: Fair   Art therapist  Concentration: Good  Attention Span: Fair  Recall: Fair  Fund of Knowledge: Good  Language: Good   Psychomotor Activity  Psychomotor Activity:No data recorded  Assets  Assets: Communication Skills; Housing; Social Support   Sleep  Sleep:No data recorded   Physical Exam: Physical Exam Vitals and nursing note reviewed.  Constitutional:      Appearance: Normal appearance.  HENT:     Head: Normocephalic and atraumatic.  Nose: Nose normal.  Pulmonary:     Effort: Pulmonary effort is normal.  Musculoskeletal:        General: Normal range of motion.     Cervical back: Normal range of motion.  Neurological:     General: No focal deficit present.     Mental Status: He is alert and oriented to person, place, and time.  Psychiatric:        Attention and Perception: Attention and perception normal.        Mood and Affect: Mood is anxious. Affect is blunt.        Speech: Speech normal.        Behavior: Behavior is hyperactive. Behavior is cooperative.        Thought Content: Thought content normal.        Cognition and Memory: Cognition and memory normal.        Judgment: Judgment normal.    Review of Systems  Psychiatric/Behavioral:  The patient is nervous/anxious.   All other systems reviewed and are negative.  Blood pressure (!) 135/96, pulse 67, temperature (!) 97.5 F (36.4 C), resp. rate 16, height 6\' 1"  (1.854 m), weight 68.9 kg, SpO2 98%. Body mass index is 20.05 kg/m.   Treatment Plan Summary: Daily contact with patient to assess and evaluate symptoms and progress in treatment and Medication management Lamictal 100 mg BID (twice daily) added to the treatment plan to address mood stabilization and symptoms of depression associated with Major Depressive Disorder or mood instability.  Lexapro (escitalopram) 20 mg  daily to address anxiety and depressive symptom Buspirone (Buspar) - 7.5 mg BID (Oral) Long-term management of generalized anxiety disorder. Olanzapine (Zyprexa) - 10 mg QHS (Oral) Management of psychosis and mood stabilization in schizoaffective disorder or schizophrenia. Propranolol (Inderal) - 20 mg Daily (Oral) Management of physical symptoms of anxiety, such as tachycardia. Continue outpatient therapy and follow-up with a psychiatrist for medication management.  Myriam Forehand, NP 11/06/2023, 4:41 PM

## 2023-11-06 NOTE — Group Note (Signed)
[facility] LCSW Group Therapy Note   Group Date: 11/06/2023 Start Time: 1300 End Time: 1400   Type of Therapy/Topic:  Group Therapy:  Emotion Regulation  Participation Level:  Minimal   Mood:  Description of Group:    The purpose of this group is to assist patients in learning to regulate negative emotions and experience positive emotions. Patients will be guided to discuss ways in which they have been vulnerable to their negative emotions. These vulnerabilities will be juxtaposed with experiences of positive emotions or situations, and patients challenged to use positive emotions to combat negative ones. Special emphasis will be placed on coping with negative emotions in conflict situations, and patients will process healthy conflict resolution skills.  Therapeutic Goals: Patient will identify two positive emotions or experiences to reflect on in order to balance out negative emotions:  Patient will label two or more emotions that they find the most difficult to experience:  Patient will be able to demonstrate positive conflict resolution skills through discussion or role plays:   Summary of Patient Progress: During group patient was present and attentive to peers and group facilitator. While the patient did not speak much or offer insight, the patient offered gentle and respectful presence. Patient was empathetic to peers. Patient did well adding to the positive group dynamic and receiving feedback from group facilitator and peers.    Therapeutic Modalities:   Cognitive Behavioral Therapy Feelings Identification Dialectical Behavioral Therapy   Lowry Ram, LCSW

## 2023-11-06 NOTE — Progress Notes (Signed)
   11/06/23 1036  Psych Admission Type (Psych Patients Only)  Admission Status Voluntary  Psychosocial Assessment  Patient Complaints Anxiety  Eye Contact Brief  Facial Expression Flat  Affect Flat  Speech Logical/coherent  Interaction Assertive  Motor Activity Slow  Appearance/Hygiene In scrubs  Behavior Characteristics Cooperative  Mood Pleasant  Thought Process  Coherency Circumstantial  Content WDL  Delusions None reported or observed  Perception WDL  Hallucination None reported or observed  Judgment WDL  Confusion None  Danger to Self  Current suicidal ideation? Denies  Danger to Others  Danger to Others None reported or observed  Danger to Others Abnormal  Harmful Behavior to others No threats or harm toward other people  Destructive Behavior No threats or harm toward property

## 2023-11-07 DIAGNOSIS — F19959 Other psychoactive substance use, unspecified with psychoactive substance-induced psychotic disorder, unspecified: Secondary | ICD-10-CM | POA: Diagnosis not present

## 2023-11-07 NOTE — Group Note (Signed)
Date:  11/07/2023 Time:  8:30 PM  Group Topic/Focus:  Wrap-Up Group:   The focus of this group is to help patients review their daily goal of treatment and discuss progress on daily workbooks.    Participation Level:  Active  Participation Quality:  Appropriate and Attentive  Affect:  Appropriate  Cognitive:  Alert and Appropriate  Insight: Appropriate  Engagement in Group:  Distracting  Modes of Intervention:  Discussion  Additional Comments:     Maglione,Morrie Daywalt E 11/07/2023, 8:30 PM

## 2023-11-07 NOTE — Progress Notes (Signed)
Pt is calm and cooperative, denies SI HI AVH.  Pt is receptive to care.  Depression 2/10, hopelessness 0/10, anxiety 3/10.  Continued monitoring for safety.    11/07/23 1700  Psych Admission Type (Psych Patients Only)  Admission Status Voluntary  Psychosocial Assessment  Patient Complaints None  Eye Contact Brief  Facial Expression Flat  Affect Flat  Speech Logical/coherent  Interaction Assertive  Motor Activity Slow  Appearance/Hygiene In scrubs  Behavior Characteristics Cooperative  Mood Pleasant  Thought Process  Coherency WDL  Content WDL  Delusions None reported or observed  Perception WDL  Hallucination None reported or observed  Judgment WDL  Confusion None  Danger to Self  Current suicidal ideation? Denies  Danger to Others  Danger to Others None reported or observed  Danger to Others Abnormal  Harmful Behavior to others No threats or harm toward other people  Destructive Behavior No threats or harm toward property

## 2023-11-07 NOTE — Progress Notes (Signed)
[facility] MD Progress Note  11/07/2023 5:41 PM Lance Fox  MRN:  [id number] Subjective:   32 year old African American male with a history of panic attacks, reports, "I am feeling better today," and is requesting discharge. He denies suicidal ideation (SI), homicidal ideation (HI), and delusions or hallucinations at this time. The patient is also requesting medication for continued management post-discharge.The patient has stabilized during inpatient care and no longer exhibits acute symptoms requiring continued hospitalization. 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)   Major depression, recurrent (HCC)  Total Time spent with patient: 45 minutes  Past Psychiatric History:  Polysubstance Abuse Anxiety ADHD   Past Medical History:  Past Medical History:  Diagnosis Date   ADHD (attention deficit hyperactivity disorder)    Anxiety     Past Surgical History:  Procedure Laterality Date   TONSILLECTOMY     Family History:  Family History  Problem Relation Age of Onset   ADD / ADHD Mother    Anxiety disorder Mother    Heart failure Other    Family Psychiatric  History: see above Social History:  Social History   Substance and Sexual Activity  Alcohol Use No     Social History   Substance and Sexual Activity  Drug Use Yes   Types: Marijuana   Comment: occasional    Social History   Socioeconomic History   Marital status: Single    Spouse name: Not on file   Number of children: Not on file   Years of education: Not on file   Highest education level: Some college, no degree  Occupational History   Not on file  Tobacco Use   Smoking status: Former    Current packs/day: 0.00    Types: Cigarettes    Quit date: 2021    Years since quitting: 3.9   Smokeless tobacco: Never  Vaping Use   Vaping  status: Every Day   Substances: Nicotine, Mixture of cannabinoids  Substance and Sexual Activity   Alcohol use: No   Drug use: Yes    Types: Marijuana    Comment: occasional   Sexual activity: Not Currently  Other Topics Concern   Not on file  Social History Narrative   Not on file   Social Determinants of Health   Financial Resource Strain: Not on file  Food Insecurity: No Food Insecurity (11/02/2023)   Hunger Vital Sign    Worried About Running Out of Food in the Last Year: Never true    Ran Out of Food in the Last Year: Never true  Transportation Needs: Unknown (11/02/2023)   PRAPARE - Administrator, Civil Service (Medical): Not on file    Lack of Transportation (Non-Medical): No  Physical Activity: Not on file  Stress: Not on file (10/15/2023)  Social Connections: Not on file   Additional Social History:                         Sleep: Good  Appetite:  Good  Current Medications: Current Facility-Administered Medications  Medication Dose Route Frequency Provider Last Rate Last Admin   acetaminophen (TYLENOL) tablet 650 mg  650 mg Oral Q6H PRN Lauree Chandler, NP       alum & mag hydroxide-simeth (MAALOX/MYLANTA) 200-200-20 MG/5ML suspension 30 mL  30 mL Oral Q4H PRN Lauree Chandler, NP  busPIRone (BUSPAR) tablet 7.5 mg  7.5 mg Oral BID Myriam Forehand, NP   7.5 mg at 11/07/23 1702   escitalopram (LEXAPRO) tablet 20 mg  20 mg Oral Daily Myriam Forehand, NP   20 mg at 11/07/23 0981   feeding supplement (ENSURE ENLIVE / ENSURE PLUS) liquid 237 mL  237 mL Oral BID BM Sarina Ill, DO   237 mL at 11/07/23 1546   hydrOXYzine (ATARAX) tablet 25 mg  25 mg Oral TID PRN Myriam Forehand, NP   25 mg at 11/05/23 1647   lamoTRIgine (LAMICTAL) tablet 100 mg  100 mg Oral BID Myriam Forehand, NP   100 mg at 11/07/23 1702   magnesium hydroxide (MILK OF MAGNESIA) suspension 30 mL  30 mL Oral Daily PRN Lauree Chandler, NP       multivitamin with  minerals tablet 1 tablet  1 tablet Oral Daily Sarina Ill, DO   1 tablet at 11/07/23 0831   OLANZapine (ZYPREXA) tablet 10 mg  10 mg Oral QHS Lauree Chandler, NP   10 mg at 11/06/23 2128   OLANZapine zydis (ZYPREXA) disintegrating tablet 5 mg  5 mg Oral TID PRN Lauree Chandler, NP       propranolol (INDERAL) tablet 20 mg  20 mg Oral Daily Myriam Forehand, NP   20 mg at 11/07/23 1914   traZODone (DESYREL) tablet 100 mg  100 mg Oral QHS Myriam Forehand, NP   100 mg at 11/06/23 2128    Lab Results: No results found for this or any previous visit (from the past 48 hour(s)).  Blood Alcohol level:  Lab Results  Component Value Date   ETH 169 (H) 03/29/2016    Metabolic Disorder Labs: No results found for: "HGBA1C", "MPG" No results found for: "PROLACTIN" Lab Results  Component Value Date   CHOL 151 11/25/2022   TRIG 45 11/25/2022   HDL 59 11/25/2022   CHOLHDL 2.6 11/25/2022   LDLCALC 82 11/25/2022    Physical Findings: AIMS:  , ,  ,  ,    CIWA:    COWS:     Musculoskeletal: Strength & Muscle Tone: within normal limits Gait & Station: normal Patient leans: N/A  Psychiatric Specialty Exam:  Presentation  General Appearance:  Appropriate for Environment; Neat  Eye Contact: Minimal  Speech: Clear and Coherent  Speech Volume: Decreased  Handedness: Right   Mood and Affect  Mood: Anxious  Affect: Blunt; Flat   Thought Process  Thought Processes: Coherent  Descriptions of Associations:Intact  Orientation:Full (Time, Place and Person) (and situation)  Thought Content:WDL  History of Schizophrenia/Schizoaffective disorder:No  Duration of Psychotic Symptoms:Less than six months  Hallucinations:No data recorded Ideas of Reference:None  Suicidal Thoughts:No data recorded Homicidal Thoughts:No data recorded  Sensorium  Memory: Immediate Fair; Remote Fair  Judgment: Fair  Insight: Fair   Art therapist   Concentration: Good  Attention Span: Fair  Recall: Fair  Fund of Knowledge: Good  Language: Good   Psychomotor Activity  Psychomotor Activity:No data recorded  Assets  Assets: Communication Skills; Housing; Social Support   Sleep  Sleep:No data recorded   Physical Exam: Physical Exam Vitals and nursing note reviewed.  Constitutional:      Appearance: Normal appearance.  HENT:     Head: Normocephalic and atraumatic.     Nose: Nose normal.  Pulmonary:     Effort: Pulmonary effort is normal.  Musculoskeletal:        General:  Normal range of motion.     Cervical back: Normal range of motion.  Neurological:     General: No focal deficit present.     Mental Status: He is alert and oriented to person, place, and time. Mental status is at baseline.  Psychiatric:        Attention and Perception: Attention and perception normal.        Mood and Affect: Mood and affect normal.        Speech: Speech normal.        Behavior: Behavior normal. Behavior is cooperative.        Thought Content: Thought content normal.        Cognition and Memory: Cognition and memory normal.        Judgment: Judgment normal.    Review of Systems  Psychiatric/Behavioral:  The patient is nervous/anxious.   All other systems reviewed and are negative.  Blood pressure 110/79, pulse 68, temperature 98.7 F (37.1 C), resp. rate 18, height 6\' 1"  (1.854 m), weight 68.9 kg, SpO2 97%. Body mass index is 20.05 kg/m.   Treatment Plan Summary: Daily contact with patient to assess and evaluate symptoms and progress in treatment and Medication management Lamictal 100 mg BID (twice daily) added to the treatment plan to address mood stabilization and symptoms of depression associated with Major Depressive Disorder or mood instability.  Lexapro (escitalopram) 20 mg daily to address anxiety and depressive symptom Buspirone (Buspar) - 7.5 mg BID (Oral) Long-term management of generalized anxiety  disorder. Olanzapine (Zyprexa) - 10 mg QHS (Oral) Management of psychosis and mood stabilization in schizoaffective disorder or schizophrenia. Propranolol (Inderal) - 20 mg Daily (Oral) Management of physical symptoms of anxiety, such as tachycardia. Continue outpatient therapy and follow-up with a psychiatrist for medication management. Myriam Forehand, NP 11/07/2023, 5:41 PM

## 2023-11-07 NOTE — Group Note (Signed)
Date:  11/07/2023 Time:  11:15 AM  Group Topic/Focus:  Goals Group:   The focus of this group is to help patients establish daily goals to achieve during treatment and discuss how the patient can incorporate goal setting into their daily lives to aide in recovery.    Participation Level:  Active  Participation Quality:  Appropriate  Affect:  Appropriate  Cognitive:  Appropriate  Insight: Appropriate  Engagement in Group:  Engaged  Modes of Intervention:  Activity  Additional Comments:    Lance Fox 11/07/2023, 11:15 AM

## 2023-11-07 NOTE — Group Note (Signed)
Date:  11/07/2023 Time:  4:18 AM  Group Topic/Focus:  Wrap-Up Group:   The focus of this group is to help patients review their daily goal of treatment and discuss progress on daily workbooks.    Participation Level:  Active  Participation Quality:  Appropriate and Attentive  Affect:  Appropriate  Cognitive:  Alert and Appropriate  Insight: Appropriate and Good  Engagement in Group:  Engaged  Modes of Intervention:  Discussion  Additional Comments:     Maglione,Jostin Rue E 11/07/2023, 4:18 AM

## 2023-11-07 NOTE — Group Note (Signed)
Date:  11/07/2023 Time:  5:21 PM  Group Topic/Focus:  Rediscovering Joy:   The focus of this group is to explore various ways to relieve stress in a positive manner.    Participation Level:  Active  Participation Quality:  Appropriate  Affect:  Appropriate  Cognitive:  Appropriate  Insight: Appropriate  Engagement in Group:  Engaged  Modes of Intervention:  Socialization  Additional Comments:    Wilford Corner 11/07/2023, 5:21 PM

## 2023-11-07 NOTE — Plan of Care (Signed)
  Problem: Activity: Goal: Interest or engagement in activities will improve Outcome: Progressing

## 2023-11-08 NOTE — Plan of Care (Signed)

## 2023-11-08 NOTE — Group Note (Unsigned)
Date:  11/08/2023 Time:  3:00 PM  Group Topic/Focus:  Healthy Communication:   The focus of this group is to discuss communication, barriers to communication, as well as healthy ways to communicate with others. MoviesTherapy encourages emotional release Even those who often have trouble expressing their emotions might find themselves laughing or crying during a film. This release of emotions can have a cathartic effect and also make it easier for a person to become more comfortable in expressing their emotions.     Participation Level:  {BHH PARTICIPATION ZHYQM:57846}  Participation Quality:  {BHH PARTICIPATION QUALITY:22265}  Affect:  {BHH AFFECT:22266}  Cognitive:  {BHH COGNITIVE:22267}  Insight: {BHH Insight2:20797}  Engagement in Group:  {BHH ENGAGEMENT IN GROUP:22268}  Modes of Intervention:  {BHH MODES OF INTERVENTION:22269}  Additional Comments:  ***  Dynesha Woolen 11/08/2023, 3:00 PM

## 2023-11-08 NOTE — Progress Notes (Signed)
D- Patient alert and oriented. Patient presents in an anxious, but pleasant mood on assessment reporting that he slept good last night and had no complaints to voice to this Clinical research associate. Patient denies depression, but endorsed anxiety, rating it a "2/10". Patient states that he is just ready to go home. Patient also denies SI, HI, AVH, and pain at this time. Patient's goal for today is to "get out of my room and socialize, and participation", in which he will "get up and interact with each other", in order to achieve his goal.   A- Scheduled medications administered to patient, per MD orders. Support and encouragement provided. Routine safety checks conducted every 15 minutes. Patient informed to notify staff with problems or concerns.  R- No adverse drug reactions noted. Patient contracts for safety at this time. Patient compliant with medications and treatment plan. Patient receptive, calm, and cooperative. Patient interacts well with others on the unit. Patient remains safe at this time.

## 2023-11-08 NOTE — Group Note (Signed)
Date:  11/08/2023 Time:  10:26 AM  Group Topic/Focus:  Spirituality:   The focus of this group is to discuss how one's spirituality can aide in recovery.    Participation Level:  Active  Participation Quality:  Appropriate  Affect:  Appropriate  Cognitive:  Appropriate  Insight: Appropriate  Engagement in Group:  Engaged  Modes of Intervention:  Activity  Additional Comments:    Mary Sella Donnabelle Blanchard 11/08/2023, 10:26 AM

## 2023-11-08 NOTE — Group Note (Signed)
Date:  11/08/2023 Time:  3:19 PM  Group Topic/Focus:  Activity Group:  The focus of the group is to promote activity for the patients and encourage them to come outside in the courtyard and get some fresh air and some exercise.    Participation Level:  Did Not Attend   Lance Fox 11/08/2023, 3:19 PM

## 2023-11-08 NOTE — Progress Notes (Signed)
  [facility] Adult Case Management Discharge Plan :  Will you be returning to the same living situation after discharge:  Yes,  [address] At discharge, do you have transportation home?: Yes,  The patient mom will pick him up . Do you have the ability to pay for your medications: Yes,  The patient stated that he have the ability to pay.  Release of information consent forms completed and in the chart;  Patient's signature needed at discharge.  Patient to Follow up at:  Follow-up Information     [facility] Go to.   Why: Appointment scheduled for 11/18/23 at 8:30 AM with [facility] information: [address]  [address] [place] [address] [phone]                 Next level of care provider has access to [facility] Link:yes  Safety Planning and Suicide Prevention discussed: Yes,  Pollie Friar, mother, [phone]      Has patient been referred to the Quitline?: Patient refused referral for treatment  Patient has been referred for addiction treatment: Yes, the patient will follow up with an outpatient provider for substance use disorder. Therapist: appointment made  Marshell Levan, LCSW 11/08/2023, 10:11 AM
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
## 2023-11-08 NOTE — Plan of Care (Signed)

## 2023-11-08 NOTE — Discharge Summary (Addendum)
Physician Discharge Summary Note  Patient:  Lance Fox is an 32 y.o., male MRN:  [id number] DOB:  05-13-1991 Patient phone:  [phone] (home)  Patient address:   [address] [place] [address],  Total Time spent with patient: 2 hours  Date of Admission:  11/02/2023 Date of Discharge: 11/08/2023  Reason for Admission:  32 year old African American male, was admitted due to escalating emotional distress and a reported history of overwhelming thoughts linked to past trauma. He expressed difficulty coping with these thoughts, which were triggered by recollections of his cousin and past experiences. Although he denies current suicidal ideation (SI), homicidal ideation (HI), or psychotic symptoms, he reported a desire to address unresolved trauma and acknowledged feelings of being "overwhelmed."The patient has a history of anxiety, depression, and trauma-related distress. Prior to admission, he experienced persistent emotional dysregulation and ruminative thoughts that impaired his ability to function effectively. His admission aims to provide stabilization, address trauma-related concerns, and establish a treatment plan to support recovery and emotional resilience.  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)   Past Psychiatric History: Polysubstance Abuse Anxiety ADHD   Past Medical History:  Past Medical History:  Diagnosis Date   ADHD (attention deficit hyperactivity disorder)    Anxiety     Past Surgical History:  Procedure Laterality Date   TONSILLECTOMY     Family History:  Family History  Problem Relation Age of Onset   ADD / ADHD Mother    Anxiety disorder Mother    Heart failure Other    Family Psychiatric  History: see above  Social History:  Social History   Substance and Sexual Activity   Alcohol Use No     Social History   Substance and Sexual Activity  Drug Use Yes   Types: Marijuana   Comment: occasional    Social History   Socioeconomic History   Marital status: Single    Spouse name: Not on file   Number of children: Not on file   Years of education: Not on file   Highest education level: Some college, no degree  Occupational History   Not on file  Tobacco Use   Smoking status: Former    Current packs/day: 0.00    Types: Cigarettes    Quit date: 2021    Years since quitting: 3.9   Smokeless tobacco: Never  Vaping Use   Vaping status: Every Day   Substances: Nicotine, Mixture of cannabinoids  Substance and Sexual Activity   Alcohol use: No   Drug use: Yes    Types: Marijuana    Comment: occasional   Sexual activity: Not Currently  Other Topics Concern   Not on file  Social History Narrative   Not on file   Social Determinants of Health   Financial Resource Strain: Not on file  Food Insecurity: No Food Insecurity (11/02/2023)   Hunger Vital Sign    Worried About Running Out of Food in the Last Year: Never true    Ran Out of Food in the Last Year: Never true  Transportation Needs: Unknown (11/02/2023)   PRAPARE - Administrator, Civil Service (Medical): Not on file    Lack of Transportation (Non-Medical): No  Physical Activity: Not on file  Stress: Not on file (10/15/2023)  Social Connections: Not on file    Hospital Course:  32 year old African American  male with a history of anxiety, depression, and trauma-related distress, was admitted for stabilization following reports of overwhelming thoughts linked to past trauma. The patient expressed a desire to process unresolved trauma and acknowledged feeling "overwhelmed" prior to admission. He denied suicidal ideation (SI), homicidal ideation (HI), or psychotic symptoms during his stay.Reported ruminative thoughts about his cousin and past trauma.Expressed interest in discussing  trauma with a therapist post-discharge.Denied active SI, HI, delusions, or hallucinations but appeared dysphoric and anxious.Psychoeducation on managing ruminative thoughts and grounding techniques provided.Encouraged engagement in therapy to process trauma-related distress.Labs revealed low sodium (134) and elevated glucose (140 mg/dL, non-fasting).UDS positive for cannabinoids .Patient was medically cleared prior to psychiatric admission and managed with ongoing monitoring during hospitalization.Patient was cooperative but presented with blocked speech and required extended time to respond to questions.Periodically appeared to be responding to internal stimuli, though he denied hallucinations.Expressed motivation to address trauma and identified a need for ongoing support after discharge.Stabilized mood and anxiety symptoms with medication adjustments. Demonstrated insight into the importance of follow-up care and willingness to engage in trauma-focused therapy.No acute psychiatric symptoms or safety concerns by discharge.Appointment scheduled with [facility] on 11/18/23 at 8:30 AM for therapy and medication management.Provided patient with crisis hotline information and emergency contacts for immediate support.The patient was discharged in stable condition, with a plan for outpatient follow-up to address trauma, continue medication management, and monitor ongoing progress.      Musculoskeletal: Strength & Muscle Tone: within normal limits Gait & Station: normal Patient leans: N/A   Psychiatric Specialty Exam:  Presentation  General Appearance:  Appropriate for Environment; Neat; Well Groomed  Eye Contact: Good  Speech: Clear and Coherent  Speech Volume: Normal  Handedness: Right   Mood and Affect  Mood: Euthymic  Affect: Appropriate   Thought Process  Thought Processes: Coherent  Descriptions of Associations:Intact  Orientation:-- (and  situation)  Thought Content:Logical; WDL  History of Schizophrenia/Schizoaffective disorder:No  Duration of Psychotic Symptoms:Less than six months  Hallucinations:Hallucinations: None Description of Auditory Hallucinations: denies Description of Visual Hallucinations: denies  Ideas of Reference:None  Suicidal Thoughts:Suicidal Thoughts: No  Homicidal Thoughts:Homicidal Thoughts: No   Sensorium  Memory: Immediate Good; Remote Good  Judgment: Fair  Insight: Fair   Art therapist  Concentration: Good  Attention Span: Fair  Recall: Good  Fund of Knowledge: Good  Language: Good   Psychomotor Activity  Psychomotor Activity: Psychomotor Activity: Normal   Assets  Assets: Communication Skills; Housing; Health and safety inspector; Social Support   Sleep  Sleep: Sleep: Good Number of Hours of Sleep: 8    Physical Exam: Physical Exam Vitals and nursing note reviewed.  Constitutional:      Appearance: Normal appearance.  HENT:     Head: Normocephalic and atraumatic.     Nose: Nose normal.  Pulmonary:     Effort: Pulmonary effort is normal.  Musculoskeletal:        General: Normal range of motion.     Cervical back: Normal range of motion.  Neurological:     General: No focal deficit present.     Mental Status: He is alert and oriented to person, place, and time. Mental status is at baseline.  Psychiatric:        Attention and Perception: Attention and perception normal.        Mood and Affect: Mood and affect normal.        Speech: Speech normal.        Behavior: Behavior normal. Behavior is cooperative.  Thought Content: Thought content normal.        Cognition and Memory: Cognition and memory normal.        Judgment: Judgment normal.    ROS Blood pressure 124/84, pulse 89, temperature (!) 97.5 F (36.4 C), resp. rate 20, height 6\' 1"  (1.854 m), weight 68.9 kg, SpO2 95%. Body mass index is 20.05 kg/m.   Social History    Tobacco Use  Smoking Status Former   Current packs/day: 0.00   Types: Cigarettes   Quit date: 2021   Years since quitting: 3.9  Smokeless Tobacco Never   Tobacco Cessation:  N/A, patient does not currently use tobacco products   Blood Alcohol level:  Lab Results  Component Value Date   ETH 169 (H) 03/29/2016    Metabolic Disorder Labs:  No results found for: "HGBA1C", "MPG" No results found for: "PROLACTIN" Lab Results  Component Value Date   CHOL 151 11/25/2022   TRIG 45 11/25/2022   HDL 59 11/25/2022   CHOLHDL 2.6 11/25/2022   LDLCALC 82 11/25/2022    See Psychiatric Specialty Exam and Suicide Risk Assessment completed by Attending Physician prior to discharge.  Discharge destination:  Home  Is patient on multiple antipsychotic therapies at discharge:  No   Has Patient had three or more failed trials of antipsychotic monotherapy by history:  No  Recommended Plan for Multiple Antipsychotic Therapies: NA   Allergies as of 11/08/2023   No Known Allergies      Medication List     TAKE these medications      Indication  busPIRone 7.5 MG tablet Commonly known as: BUSPAR Take 1 tablet (7.5 mg total) by mouth 2 (two) times daily.  Indication: Anxiety Disorder   escitalopram 20 MG tablet Commonly known as: LEXAPRO Take 1 tablet (20 mg total) by mouth daily.  Indication: Major Depressive Disorder   hydrOXYzine 25 MG tablet Commonly known as: ATARAX Take 1 tablet (25 mg total) by mouth 3 (three) times daily as needed for anxiety.  Indication: Feeling Anxious, Feeling Tense   lamoTRIgine 100 MG tablet Commonly known as: LAMICTAL Take 1 tablet (100 mg total) by mouth 2 (two) times daily for 3 days.  Indication: Depressive Phase of Manic-Depression   multivitamin with minerals Tabs tablet Take 1 tablet by mouth daily.  Indication: supplement   OLANZapine 10 MG tablet Commonly known as: ZYPREXA Take 1 tablet (10 mg total) by mouth at bedtime.   Indication: Anorexia Nervosa, Major Depressive Disorder   propranolol 20 MG tablet Commonly known as: INDERAL Take 1 tablet (20 mg total) by mouth daily.  Indication: Feeling Anxious, High Blood Pressure   traZODone 100 MG tablet Commonly known as: DESYREL Take 1 tablet (100 mg total) by mouth at bedtime.  Indication: Trouble Sleeping, Major Depressive Disorder        Follow-up Information     [facility].   Why: Appointment scheduled for 11/18/23 at 8:30 AM with [facility] information: [address]  [address] [place] [address] [phone]                 Follow-up recommendations:  Activity:  as tolerated Diet:  heart healthy  Comments:   Lamictal 100 mg BID: For mood stabilization and depressive symptoms. Lexapro 20 mg daily: To address anxiety and depressive symptom Buspar 7.5 mg BID: For ongoing anxiety management. Propranolol 10 mg daily: To alleviate physical symptoms of anxiety, such as tachycardia. National Suicide Prevention Lifeline: 1-800-273-TALK 907 017 5290). Crisis Text Line:  Text HOME to [phone] Referral to outpatient psychiatric care for medication management and ongoing therapy. Schedule follow-up to assess medication efficacy and symptom progression. [facility]12/11/24 at 8:30 AM Reason: Follow-up for psychiatric evaluation, medication management, and ongoing therapy.  Signed: Myriam Forehand, NP 11/08/2023, 2:19 PM

## 2023-11-08 NOTE — Progress Notes (Signed)
Patient ID: Lance Fox, male   DOB: September 27, 1991, 32 y.o.   MRN: [id number]  Discharge Note:  Patient denies SI/HI/AVH at this time. Discharge instructions, AVS, prescriptions, and transition record gone over with patient. Patient given a copy of his Suicide Safety Plan. Patient agrees to comply with medication management, follow-up visit, and outpatient therapy. Patient belongings were all on his person. Patient questions and concerns addressed and answered. Patient ambulatory off unit. Patient discharged to home with his Mom.

## 2024-01-15 ENCOUNTER — Other Ambulatory Visit: Payer: Self-pay

## 2024-02-10 ENCOUNTER — Encounter: Payer: BLUE CROSS/BLUE SHIELD | Admitting: Psychology

## 2024-09-09 ENCOUNTER — Emergency Department
Admission: EM | Admit: 2024-09-09 | Discharge: 2024-09-09 | Disposition: A | Discharge status: Home / Self Care | Attending: Emergency Medicine | Admitting: Emergency Medicine

## 2024-09-09 ENCOUNTER — Other Ambulatory Visit: Payer: Self-pay

## 2024-09-09 DIAGNOSIS — U071 COVID-19: Secondary | ICD-10-CM | POA: Diagnosis not present

## 2024-09-09 DIAGNOSIS — R509 Fever, unspecified: Secondary | ICD-10-CM | POA: Diagnosis present

## 2024-09-09 LAB — RESP PANEL BY RT-PCR (RSV, FLU A&B, COVID)  RVPGX2
Influenza A by PCR: NEGATIVE
Influenza B by PCR: NEGATIVE
Resp Syncytial Virus by PCR: NEGATIVE
SARS Coronavirus 2 by RT PCR: POSITIVE — AB

## 2024-09-09 MED ORDER — NIRMATRELVIR/RITONAVIR (PAXLOVID)TABLET: 3.0000 | ORAL_TABLET | Freq: Two times a day (BID) | ORAL | 0 refills | Status: AC

## 2024-09-09 MED ORDER — NIRMATRELVIR/RITONAVIR (PAXLOVID)TABLET
3.0000 | ORAL_TABLET | Freq: Two times a day (BID) | ORAL | 0 refills | Status: DC
Start: 2024-09-09 — End: 2024-09-09

## 2024-09-09 NOTE — ED Provider Notes (Signed)
 [facility] Provider Note    Event Date/Time   First MD Initiated Contact with Patient 09/09/24 2053     (approximate)   History   Fever    HPI  Lance Fox is a 33 y.o. male    with a past medical history of paranoia, psychosis, laceration, who presents to the ED complaining of fever. According to the patient, symptoms started last Monday, 5 days ago with fever, chills, sore throat, weakness, anorexia.  Patient states he was in contact with his father.     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) 11/06/2023   Major depression, recurrent 11/06/2023   Marijuana abuse 11/02/2023   Hx of psychoactive drug abuse (HCC) 11/01/2023   Substance-induced psychotic disorder with hallucinations (HCC) 11/01/2023   Attention and concentration deficit 02/09/2023   Alcohol use disorder, moderate, in sustained remission (HCC) 02/09/2023   Long term current use of cannabis 02/09/2023   Insomnia 02/09/2023   ADHD 10/22/2022   Weight loss 10/22/2022     ROS: Patient currently denies any vision changes, tinnitus, difficulty speaking, facial droop, sore throat, chest pain, shortness of breath, abdominal pain, nausea/vomiting/diarrhea, dysuria, or weakness/numbness/paresthesias in any extremity   Physical Exam   Triage Vital Signs: ED Triage Vitals  Encounter Vitals Group     BP 09/09/24 2019 (!) 128/96     Girls Systolic BP Percentile --      Girls Diastolic BP Percentile --      Boys Systolic BP Percentile --      Boys Diastolic BP Percentile --      Pulse Rate 09/09/24 2019 89     Resp 09/09/24 2019 18     Temp 09/09/24 2019 98.4 F (36.9 C)     Temp Source 09/09/24 2019 Oral     SpO2 09/09/24 2019 100 %     Weight 09/09/24 2020 165 lb (74.8 kg)     Height 09/09/24 2020 6' 1 (1.854 m)     Head Circumference --      Peak Flow --      Pain Score 09/09/24 2020 4     Pain Loc --       Pain Education --      Exclude from Growth Chart --     Most recent vital signs: Vitals:   09/09/24 2019  BP: (!) 128/96  Pulse: 89  Resp: 18  Temp: 98.4 F (36.9 C)  SpO2: 100%     Physical Exam Vitals and nursing note reviewed.  During triage patient was hypertensive  General:          Awake, no distress.  Mouth: Peritonsillar erythema, no exudates. CV:                  Good peripheral perfusion.  Resp:               Normal effort. no tachypnea no wheezing Abd:                 No distention.  Soft nontender             ED Results / Procedures / Treatments   Labs (all labs ordered are listed, but only abnormal results are displayed) Labs Reviewed  RESP PANEL BY RT-PCR (RSV, FLU A&B, COVID)  RVPGX2 - Abnormal; Notable for the following components:      Result Value   SARS  Coronavirus 2 by RT PCR POSITIVE (*)    All other components within normal limits      PROCEDURES:  Critical Care performed:   Procedures   MEDICATIONS ORDERED IN ED: Medications - No data to display Clinical Course as of 09/09/24 2202  Fri Sep 09, 2024  2146 Resp panel by RT-PCR (RSV, Flu A&B, Covid) Anterior Nasal Swab(!) Coronavirus positive [AE]    Clinical Course User Index [AE] Janit Kast, PA-C    IMPRESSION / MDM / ASSESSMENT AND PLAN / ED COURSE  I reviewed the triage vital signs and the nursing notes.  Differential diagnosis includes, but is not limited to, COVID, RSV, influenza, pneumonia.  Patient's presentation is most consistent with acute complicated illness / injury requiring diagnostic workup.   Lance Fox is a 33 y.o., male presents today with history of 5 days of fever, chills, sore throat, weakness, decreased appetite, adynamia.  On a physical exam, throat presents with peritonsillar erythema, cardiopulmonary is clear, rest of the physical exam is normal. Patient's diagnosis is consistent with COVID. I did not order any imaging .labs are   reassuring. I did review the patient's allergies and medications.The patient is in stable and satisfactory condition for discharge home  Patient will be discharged home with prescriptions for Paxlovid. Patient is to follow up with PCP as needed or otherwise directed. Patient is given ED precautions to return to the ED for any worsening or new symptoms.  Work note was provided Discussed plan of care with patient, answered all of patient's questions, Patient agreeable to plan of care. Advised patient to take medications according to the instructions on the label. Discussed possible side effects of new medications. Patient verbalized understanding.  FINAL CLINICAL IMPRESSION(S) / ED DIAGNOSES   Final diagnoses:  COVID     Rx / DC Orders   ED Discharge Orders          Ordered    nirmatrelvir/ritonavir (PAXLOVID) 20 x 150 MG & 10 x 100MG  TABS  2 times daily,   Status:  Discontinued        09/09/24 2200    nirmatrelvir/ritonavir (PAXLOVID) 20 x 150 MG & 10 x 100MG  TABS  2 times daily        09/09/24 2201             Note:  This document was prepared using Dragon voice recognition software and may include unintentional dictation errors.   Janit Kast, PA-C 09/09/24 2202    Floy Roberts, MD 09/09/24 2223

## 2024-09-09 NOTE — ED Triage Notes (Signed)
 Pt presented to ED POV with c/o fever starting on Monday, fatigue, generalized body aches, and nausea. States known contact with family member who had COVID. States has been taking Nyquil and tylenol  as needed, states symptoms have improved.

## 2024-09-09 NOTE — Discharge Instructions (Addendum)
 You have been diagnosed with COVID-19.  Please drink plenty of fluids.  You can take Paxlovid 3 tablets by mouth every 12 hours for 5 days.  Please be isolated for 5 days.  Please come back to ED or go to your PCP if you have new symptoms or symptoms worsen

## 2024-09-09 NOTE — ED Notes (Signed)
 Pt given DC instructions. Pt verbalized understanding of medication and follow up care. Pt ambulatory from ED without difficulty. NAD noted at time of departure.

## 2024-11-12 ENCOUNTER — Ambulatory Visit (HOSPITAL_COMMUNITY)
Admission: EM | Admit: 2024-11-12 | Discharge: 2024-11-12 | Disposition: A | Discharge status: Home / Self Care | Attending: Urology | Admitting: Urology

## 2024-11-12 DIAGNOSIS — F411 Generalized anxiety disorder: Secondary | ICD-10-CM

## 2024-11-12 DIAGNOSIS — G4700 Insomnia, unspecified: Secondary | ICD-10-CM

## 2024-11-12 MED ORDER — HYDROXYZINE HCL 25 MG PO TABS: 25.0000 mg | ORAL_TABLET | Freq: Three times a day (TID) | ORAL | 0 refills | Status: DC | PRN

## 2024-11-12 MED ORDER — TRAZODONE HCL 100 MG PO TABS: 100.0000 mg | ORAL_TABLET | Freq: Every day | ORAL | 0 refills | Status: AC

## 2024-11-12 NOTE — Discharge Instructions (Addendum)
 Outpatient Resources   [facility] [address],  [place], [place], [phone]  [facility] [address] [place], [place] [address] [phone]   [facility]  [address],  [place], [place] [address] [phone] ?  [facility] [address]OVAK  [place], [place] [address] [phone]   [facility]--[place] [phone] Susann Solon [place], [place] [address] [phone]   Psychological testing and ADHD testing  [facility] [address] [place], [place] [address]  [phone]   [facility] [address] [place] [place], [place] [address]   Adult Therapy and Medication Management Resources   [facility] [address],  [place], [place], [address] [place] [phone]  [facility] [address] [place], [place] [address] [phone], PLLC [address],  [place], [place] [address]   [facility] [address] [place], [place] [address] [phone]   Psychological testing and ADHD testing  [facility] CHARLENA Louder [place], [place] [address] [phone]   [facility] [address] [place], [place] [address] [address] [facility]  [address] [place], [place] [address] Phone: [phone]  [facility] [address] [place], [place] [address] Phone: [phone] Fax: [phone]   Discharge recommendations:  Patient is to take medications as prescribed. Please see information for follow-up appointment with psychiatry and therapy. Please follow up with your primary care provider for all medical related needs.   Therapy: We recommend that patient  participate in individual therapy to address mental health concerns.  Medications: The patient or guardian is to contact a medical professional and/or outpatient provider to address any new side effects that develop. The patient or guardian should update outpatient providers of any new medications and/or medication changes.   Atypical antipsychotics: If you are prescribed an atypical antipsychotic, it is recommended that your height, weight, BMI, blood pressure, fasting lipid panel, and fasting blood sugar be monitored by your outpatient providers.  Safety:  The patient should abstain from use of illicit substances/drugs and abuse of any medications. If symptoms worsen or do not continue to improve or if the patient becomes actively suicidal or homicidal then it is recommended that the patient return to the closest hospital emergency department, the [facility], or call 911 for further evaluation and treatment. National Suicide Prevention Lifeline 1-800-SUICIDE or (952) 096-8792.  About 988 988 offers 24/7 access to trained crisis counselors who can help people experiencing mental health-related distress. People can call or text 988 or chat 988lifeline.org for themselves or if they are worried about a loved one who may need crisis support.  Crisis Mobile: [facility]:                     [phone] (for crisis response 24 hours a day) [facility] Hotline:                                            484-381-3562
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
## 2024-11-12 NOTE — ED Provider Notes (Signed)
 [facility] Medical Screening Exam  Patient Name: Lance Fox MRN: [id number] Date of Evaluation: 11/12/24 Chief Complaint:   Diagnosis:  Final diagnoses:  Generalized anxiety disorder  Insomnia, unspecified type    History of Present illness: Lance Fox is a 33 y.o. male. ***  Flowsheet Row ED from 11/12/2024 in [facility] ED from 09/09/2024 in [facility] Emergency Department at [facility] Admission (Discharged) from 11/02/2023 in [facility] INPATIENT BEHAVIORAL MEDICINE  C-SSRS RISK CATEGORY No Risk No Risk No Risk    Psychiatric Specialty Exam  Presentation  General Appearance:Appropriate for Environment  Eye Contact:Good  Speech:Clear and Coherent  Speech Volume:Normal  Handedness:Right   Mood and Affect  Mood: Anxious  Affect: Congruent   Thought Process  Thought Processes: Coherent  Descriptions of Associations:Intact  Orientation:Full (Time, Place and Person)  Thought Content:WDL  Diagnosis of Schizophrenia or Schizoaffective disorder in past: No data recorded Duration of Psychotic Symptoms: No data recorded Hallucinations:None  Ideas of Reference:None  Suicidal Thoughts:No  Homicidal Thoughts:No   Sensorium  Memory: Immediate Good; Recent Fair; Remote Fair  Judgment: Fair  Insight: Fair   Art Therapist  Concentration: Good  Attention Span: Good  Recall: Good  Fund of Knowledge: Good  Language: Good   Psychomotor Activity  Psychomotor Activity: Normal   Assets  Assets: Communication Skills; Desire for Improvement; Physical Health; Social Support; English As A Second Language Teacher; Vocational/Educational   Sleep  Sleep: Poor  Number of hours:  3   Physical Exam: Physical Exam ROS Blood pressure (!) 143/96, pulse (!) 124, temperature 97.9 F (36.6 C), temperature source Temporal, resp. rate 16, SpO2 99%. There is no height or weight on file to calculate  BMI.  Musculoskeletal: Strength & Muscle Tone: {desc; muscle tone:32375} Gait & Station: {PE GAIT ED NATL:22525} Patient leans: {Patient Leans:21022755}   [facility] MSE Discharge Disposition for Follow up and Recommendations: {[facility] MSE Recommendations:24277}   [name] A Bryston Colocho, NP 11/12/2024, 10:03 PM

## 2024-12-22 ENCOUNTER — Other Ambulatory Visit: Payer: Self-pay

## 2024-12-22 ENCOUNTER — Emergency Department: Admission: EM | Admit: 2024-12-22 | Discharge: 2024-12-22 | Disposition: A | Discharge status: Home / Self Care

## 2024-12-22 DIAGNOSIS — M25561 Pain in right knee: Secondary | ICD-10-CM | POA: Diagnosis present

## 2024-12-22 MED ORDER — PREDNISONE 50 MG PO TABS: ORAL_TABLET | ORAL | 0 refills | Status: DC

## 2024-12-22 NOTE — ED Provider Notes (Signed)
 "  [facility] Provider Note    Event Date/Time   First MD Initiated Contact with Patient 12/22/24 1430     (approximate)   History   Knee Pain   HPI  Lance Fox is a 34 y.o. male PMH substance abuse, anxiety and ADHD who presents for evaluation of right knee pain.  Patient reports a few weeks ago he hit his right knee and thought would be fine but the pain never resolved.  Patient has been evaluated by an orthopedic specialist who recommended MRI as well as treatment with anti-inflammatory.'s.  Patient still waiting for the imaging to be approved by his insurance company.  Patient is looking for some additional pain relief as he is not gotten much benefit from the naproxen alone.  Patient was given a prescription for Norco which does help with his pain at night but he states he is looking for medication that he could take while at work as that is when it bothers him the most.      Physical Exam   Triage Vital Signs: ED Triage Vitals  Encounter Vitals Group     BP 12/22/24 1401 (!) 162/94     Girls Systolic BP Percentile --      Girls Diastolic BP Percentile --      Boys Systolic BP Percentile --      Boys Diastolic BP Percentile --      Pulse Rate 12/22/24 1401 98     Resp 12/22/24 1401 20     Temp 12/22/24 1401 (!) 97.4 F (36.3 C)     Temp src --      SpO2 12/22/24 1401 98 %     Weight 12/22/24 1402 168 lb (76.2 kg)     Height 12/22/24 1402 6' 1 (1.854 m)     Head Circumference --      Peak Flow --      Pain Score 12/22/24 1401 0     Pain Loc --      Pain Education --      Exclude from Growth Chart --     Most recent vital signs: Vitals:   12/22/24 1401  BP: (!) 162/94  Pulse: 98  Resp: 20  Temp: (!) 97.4 F (36.3 C)  SpO2: 98%   General: Awake, no distress.  CV:  Good peripheral perfusion.  Resp:  Normal effort.  Abd:  No distention.  R Knee: No significant swelling noted to the, no tenderness to palpation, knee range of  motion well-maintained, sensation intact in the lower extremity, posterior tibial pulses 2+ and regular.   ED Results / Procedures / Treatments   Labs (all labs ordered are listed, but only abnormal results are displayed) Labs Reviewed - No data to display  PROCEDURES:  Critical Care performed: No  Procedures   MEDICATIONS ORDERED IN ED: Medications - No data to display   IMPRESSION / MDM / ASSESSMENT AND PLAN / ED COURSE  I reviewed the triage vital signs and the nursing notes.                             34 year old male presents for evaluation of right knee pain.  Blood pressure was little elevated suspect secondary to pain, vital signs stable otherwise.  Patient NAD on exam.  Differential diagnosis includes, but is not limited to, muscle strain, meniscus injury, ligament injury, joint sprain.  Patient's presentation is most consistent with  acute, uncomplicated illness.  Considered getting x-rays but patient has not had any new falls or trauma to the knee.  Reviewed patient's chart and the PCP visit on 1/9.  Patient had x-rays done at [facility] that were unremarkable.  Do not feel that repeat x-rays would provide the information.  Discussed pain control options with the patient.  Advised him to continue taking the naproxen.  Encouraged him to also take Tylenol  throughout the day in addition to this as well as use topical pain relievers like muscle cramps and to ice it at the end of his shift.  Did offer steroid.  Patient was open to this.  Did advise him to follow-up with the orthopedic specialist.  Patient voiced understanding, all questions were answered and he was stable at discharge.      FINAL CLINICAL IMPRESSION(S) / ED DIAGNOSES   Final diagnoses:  Acute pain of right knee     Rx / DC Orders   ED Discharge Orders          Ordered    predniSONE  (DELTASONE ) 50 MG tablet        12/22/24 1511             Note:  This document was prepared using Dragon  voice recognition software and may include unintentional dictation errors.   Cleaster Tinnie LABOR, PA-C 12/22/24 1512    Rexford Reche HERO, MD 12/22/24 1536  "

## 2024-12-22 NOTE — Discharge Instructions (Addendum)
 Please continue to take the naproxen as prescribed.  I also recommend taking 650 mg every 6 hours as needed for additional pain relief.  Furthermore you can use topical pain relievers like muscle creams as well as ice and heat.  I recommend keeping your knee before work and icing afterwards.  Please follow-up with the orthopedic specialist in regards to the MRI.  Return to the emergency department with any worsening symptoms.

## 2024-12-22 NOTE — ED Triage Notes (Signed)
 Pt to ED for continued right knee pain. Pt presents wearing knee brace. States waiting on insurance approval for MRI. Reports has pain meds at home just can't take them while working when it hurts. Pt requesting cortisol shot Denies pain currently

## 2024-12-28 ENCOUNTER — Encounter: Payer: Self-pay | Admitting: Nurse Practitioner

## 2024-12-28 ENCOUNTER — Ambulatory Visit: Admitting: Nurse Practitioner

## 2024-12-28 VITALS — BP 137/87 | HR 114 | Temp 97.7°F | Ht 72.99 in | Wt 175.8 lb

## 2024-12-28 DIAGNOSIS — F321 Major depressive disorder, single episode, moderate: Secondary | ICD-10-CM | POA: Diagnosis not present

## 2024-12-28 MED ORDER — ESCITALOPRAM OXALATE 10 MG PO TABS: 10.0000 mg | ORAL_TABLET | Freq: Every day | ORAL | 0 refills | Status: AC

## 2024-12-28 MED ORDER — OLANZAPINE 2.5 MG PO TABS: 2.5000 mg | ORAL_TABLET | Freq: Every day | ORAL | 0 refills | Status: DC

## 2024-12-28 NOTE — Progress Notes (Signed)
 "  BP 137/87 (BP Location: Left Arm, Patient Position: Sitting, Cuff Size: Normal)   Pulse (!) 114   Temp 97.7 F (36.5 C) (Oral)   Ht 6' 0.99 (1.854 m)   Wt 175 lb 12.8 oz (79.7 kg)   SpO2 97%   BMI 23.20 kg/m    Subjective:    Patient ID: Lance Fox, male    DOB: [birth year], 34 y.o.   MRN: [id number]  HPI: Lance Fox is a 34 y.o. male  Chief Complaint  Patient presents with   Hospitalization Follow-up    Patient stated his leg still hurts, he is having anxiety from missing work, and ADHD. Patient is also not sleeping.   KNEE PAIN Patient states he does remember hitting his leg about 2.5 weeks ago.  He was seen at [facility]. They wanted to schedule an MRI but it going to be about $400 and he isn't sure if he can swing that.  He was given prednisone  and that seems to have helped the pain. Duration: days Involved knee: right Mechanism of injury: banged his leg Location:right side Onset: gradual Severity: 8/10  Quality:  sharp Frequency: constant Radiation: no Aggravating factors: walking  Alleviating factors: Hydrocodone and meloxicam, diclofenac - the pain did not improve  Status: worse Treatments attempted: see above  Relief with NSAIDs?:  moderate Weakness with weight bearing or walking: yes Sensation of giving way: no Locking: no Popping: no Bruising: no Swelling: no Redness: no Paresthesias/decreased sensation: no Fevers: no  ANXIETY Patient states he was seeing psychiatry but he stopped going to their office.  Patient was prescribed lamictal  and zyprexa  and lexapro .  He believes these were prescribed from a hospitalization.  He does feel like the lexapro  helped with his mood and his anxiety.  He felt like lexapro  and zyprexa  were helping him chill out.  He reports difficulty concentrating and hasn't slept in about 36 hours.  Flowsheet Row Office Visit from 12/28/2024 in [facility]  PHQ-9 Total Score 9       12/28/2024   10:35 AM 02/09/2023    9:09 AM 11/25/2022    9:59 AM 10/22/2022    1:49 PM  GAD 7 : Generalized Anxiety Score  Nervous, Anxious, on Edge 2  0  0   Control/stop worrying 2  0  0   Worry too much - different things 2  0  0   Trouble relaxing 3  0  2   Restless 0  0  0   Easily annoyed or irritable 0  1  0   Afraid - awful might happen 0  0  0   Total GAD 7 Score 9  1 2   Anxiety Difficulty Very difficult  Not difficult at all Somewhat difficult     Information is confidential and restricted. Go to Review Flowsheets to unlock data.   Data saved with a previous flowsheet row definition      Relevant past medical, surgical, family and social history reviewed and updated as indicated. Interim medical history since our last visit reviewed. Allergies and medications reviewed and updated.  Review of Systems  Psychiatric/Behavioral:  Positive for decreased concentration, dysphoric mood and sleep disturbance. Negative for suicidal ideas. The patient is nervous/anxious.     Per HPI unless specifically indicated above     Objective:    BP 137/87 (BP Location: Left Arm, Patient Position: Sitting, Cuff Size: Normal)   Pulse (!) 114   Temp 97.7 F (  36.5 C) (Oral)   Ht 6' 0.99 (1.854 m)   Wt 175 lb 12.8 oz (79.7 kg)   SpO2 97%   BMI 23.20 kg/m   Wt Readings from Last 3 Encounters:  12/28/24 175 lb 12.8 oz (79.7 kg)  12/22/24 168 lb (76.2 kg)  09/09/24 165 lb (74.8 kg)    Physical Exam Vitals and nursing note reviewed.  Constitutional:      General: He is not in acute distress.    Appearance: Normal appearance. He is not ill-appearing, toxic-appearing or diaphoretic.  HENT:     Head: Normocephalic.     Right Ear: External ear normal.     Left Ear: External ear normal.     Nose: Nose normal. No congestion or rhinorrhea.     Mouth/Throat:     Mouth: Mucous membranes are moist.  Eyes:     General:        Right eye: No discharge.        Left eye: No discharge.      Extraocular Movements: Extraocular movements intact.     Conjunctiva/sclera: Conjunctivae normal.     Pupils: Pupils are equal, round, and reactive to light.  Cardiovascular:     Rate and Rhythm: Normal rate and regular rhythm.     Heart sounds: No murmur heard. Pulmonary:     Effort: Pulmonary effort is normal. No respiratory distress.     Breath sounds: Normal breath sounds. No wheezing, rhonchi or rales.  Abdominal:     General: Abdomen is flat. Bowel sounds are normal.  Musculoskeletal:     Cervical back: Normal range of motion and neck supple.  Skin:    General: Skin is warm and dry.     Capillary Refill: Capillary refill takes less than 2 seconds.  Neurological:     General: No focal deficit present.     Mental Status: He is alert and oriented to person, place, and time.  Psychiatric:        Mood and Affect: Mood is anxious.        Behavior: Behavior normal.        Thought Content: Thought content normal.        Judgment: Judgment normal.     Results for orders placed or performed during the hospital encounter of 09/09/24  Resp panel by RT-PCR (RSV, Flu A&B, Covid) Anterior Nasal Swab   Collection Time: 09/09/24  8:22 PM   Specimen: Anterior Nasal Swab  Result Value Ref Range   SARS Coronavirus 2 by RT PCR POSITIVE (A) NEGATIVE   Influenza A by PCR NEGATIVE NEGATIVE   Influenza B by PCR NEGATIVE NEGATIVE   Resp Syncytial Virus by PCR NEGATIVE NEGATIVE      Assessment & Plan:   Problem List Items Addressed This Visit       Other   Depression, major, single episode, moderate (HCC) - Primary   Chronic. Ongoing concern.  Has had several hospitalizations related to mood disorder.  At visit today he has worsened anxiety, poor sleep, and feelings of depression.  Has been on several medications in the past but recently has been noncompliant due to lack of follow up and previous psychiatrist leaving the practice.  He would like to continue with his care here. Will restart  Lexapro  and Zyprexa .  Side effects and benefits discussed.  Follow up in 2 weeks.  Can titrate up on medications.  Can add Prazosin if nightmares persist.       Relevant Medications  escitalopram  (LEXAPRO ) 10 MG tablet     Follow up plan: Return in about 2 weeks (around 01/11/2025) for Depression/Anxiety FU.      "

## 2024-12-28 NOTE — Assessment & Plan Note (Signed)
 Chronic. Ongoing concern.  Has had several hospitalizations related to mood disorder.  At visit today he has worsened anxiety, poor sleep, and feelings of depression.  Has been on several medications in the past but recently has been noncompliant due to lack of follow up and previous psychiatrist leaving the practice.  He would like to continue with his care here. Will restart Lexapro  and Zyprexa .  Side effects and benefits discussed.  Follow up in 2 weeks.  Can titrate up on medications.  Can add Prazosin if nightmares persist.

## 2025-01-12 ENCOUNTER — Ambulatory Visit: Admitting: Nurse Practitioner

## 2025-01-12 ENCOUNTER — Encounter: Payer: Self-pay | Admitting: Nurse Practitioner

## 2025-01-12 VITALS — BP 139/82 | HR 76 | Temp 98.1°F | Ht 72.99 in | Wt 173.0 lb

## 2025-01-12 DIAGNOSIS — F321 Major depressive disorder, single episode, moderate: Secondary | ICD-10-CM | POA: Diagnosis not present

## 2025-01-12 MED ORDER — OLANZAPINE 5 MG PO TABS: 5.0000 mg | ORAL_TABLET | Freq: Every day | ORAL | 0 refills | Status: AC

## 2025-01-12 NOTE — Assessment & Plan Note (Signed)
 Chronic. Improved.  However, patient continues to struggle with sleep.  Will continue with Lexapro  10mg .  Will increase Olanzapine  to 5mg  daily.  Follow up in 1 month.  Call sooner if concerns arise.

## 2025-01-12 NOTE — Progress Notes (Signed)
 "  BP 139/82 (BP Location: Left Arm, Patient Position: Sitting, Cuff Size: Normal)   Pulse 76   Temp 98.1 F (36.7 C) (Oral)   Ht 6' 0.99 (1.854 m)   Wt 173 lb (78.5 kg)   SpO2 98%   BMI 22.83 kg/m    Subjective:    Patient ID: Lance Fox, male    DOB: 12-06-1991, 34 y.o.   MRN: [id number]  HPI: Lance Fox is a 34 y.o. male  Chief Complaint  Patient presents with   Anxiety    The patient stated his anxiety is much better   Depression    2 week f/u   ANXIETY Patient states he is feeling less anxious.  He feels like the Olanzapine  has worked in the past.  He notices an uptick in his mood with the lexapro .  He feels like the Olanzapine  isn't helping with sleep.  He was on a higher dose in the past and felt like it helped more.  He sleeps about 5 hours then goes back to work.  Denies SI.   He would still like to address his ADHD.  Flowsheet Row Office Visit from 01/12/2025 in [facility]  PHQ-9 Total Score 4      01/12/2025   10:50 AM 12/28/2024   10:35 AM 02/09/2023    9:09 AM 11/25/2022    9:59 AM  GAD 7 : Generalized Anxiety Score  Nervous, Anxious, on Edge 0 2  0   Control/stop worrying 0 2  0   Worry too much - different things 0 2  0   Trouble relaxing 1 3  0   Restless 0 0  0   Easily annoyed or irritable 0 0  1   Afraid - awful might happen 0 0  0   Total GAD 7 Score 1 9  1   Anxiety Difficulty Not difficult at all Very difficult  Not difficult at all     Information is confidential and restricted. Go to Review Flowsheets to unlock data.   Data saved with a previous flowsheet row definition      Relevant past medical, surgical, family and social history reviewed and updated as indicated. Interim medical history since our last visit reviewed. Allergies and medications reviewed and updated.  Review of Systems  Psychiatric/Behavioral:  Positive for decreased concentration, dysphoric mood and sleep disturbance. Negative for  suicidal ideas. The patient is nervous/anxious.     Per HPI unless specifically indicated above     Objective:    BP 139/82 (BP Location: Left Arm, Patient Position: Sitting, Cuff Size: Normal)   Pulse 76   Temp 98.1 F (36.7 C) (Oral)   Ht 6' 0.99 (1.854 m)   Wt 173 lb (78.5 kg)   SpO2 98%   BMI 22.83 kg/m   Wt Readings from Last 3 Encounters:  01/12/25 173 lb (78.5 kg)  12/28/24 175 lb 12.8 oz (79.7 kg)  12/22/24 168 lb (76.2 kg)    Physical Exam Vitals and nursing note reviewed.  Constitutional:      General: He is not in acute distress.    Appearance: Normal appearance. He is not ill-appearing, toxic-appearing or diaphoretic.  HENT:     Head: Normocephalic.     Right Ear: External ear normal.     Left Ear: External ear normal.     Nose: Nose normal. No congestion or rhinorrhea.     Mouth/Throat:     Mouth: Mucous membranes are moist.  Eyes:     General:        Right eye: No discharge.        Left eye: No discharge.     Extraocular Movements: Extraocular movements intact.     Conjunctiva/sclera: Conjunctivae normal.     Pupils: Pupils are equal, round, and reactive to light.  Cardiovascular:     Rate and Rhythm: Normal rate and regular rhythm.     Heart sounds: No murmur heard. Pulmonary:     Effort: Pulmonary effort is normal. No respiratory distress.     Breath sounds: Normal breath sounds. No wheezing, rhonchi or rales.  Abdominal:     General: Abdomen is flat. Bowel sounds are normal.  Musculoskeletal:     Cervical back: Normal range of motion and neck supple.  Skin:    General: Skin is warm and dry.     Capillary Refill: Capillary refill takes less than 2 seconds.  Neurological:     General: No focal deficit present.     Mental Status: He is alert and oriented to person, place, and time.  Psychiatric:        Mood and Affect: Mood normal.        Behavior: Behavior normal.        Thought Content: Thought content normal.        Judgment: Judgment  normal.     Results for orders placed or performed during the hospital encounter of 09/09/24  Resp panel by RT-PCR (RSV, Flu A&B, Covid) Anterior Nasal Swab   Collection Time: 09/09/24  8:22 PM   Specimen: Anterior Nasal Swab  Result Value Ref Range   SARS Coronavirus 2 by RT PCR POSITIVE (A) NEGATIVE   Influenza A by PCR NEGATIVE NEGATIVE   Influenza B by PCR NEGATIVE NEGATIVE   Resp Syncytial Virus by PCR NEGATIVE NEGATIVE      Assessment & Plan:   Problem List Items Addressed This Visit       Other   Depression, major, single episode, moderate (HCC) - Primary   Chronic. Improved.  However, patient continues to struggle with sleep.  Will continue with Lexapro  10mg .  Will increase Olanzapine  to 5mg  daily.  Follow up in 1 month.  Call sooner if concerns arise.          Follow up plan: Return in about 1 month (around 02/09/2025) for Depression/Anxiety FU.      "

## 2025-02-09 ENCOUNTER — Ambulatory Visit: Admitting: Nurse Practitioner
